# Patient Record
Sex: Female | Born: 1940 | Race: White | Hispanic: No | Smoking: Never smoker
Health system: Southern US, Community
[De-identification: ages and names within clinical notes are randomized; demographics above are authoritative.]

## PROBLEM LIST (undated history)

## (undated) DIAGNOSIS — F32A Depression, unspecified: Secondary | ICD-10-CM

## (undated) DIAGNOSIS — R159 Full incontinence of feces: Secondary | ICD-10-CM

## (undated) DIAGNOSIS — L4050 Arthropathic psoriasis, unspecified: Secondary | ICD-10-CM

## (undated) DIAGNOSIS — G4730 Sleep apnea, unspecified: Secondary | ICD-10-CM

## (undated) DIAGNOSIS — K5730 Diverticulosis of large intestine without perforation or abscess without bleeding: Secondary | ICD-10-CM

## (undated) DIAGNOSIS — K219 Gastro-esophageal reflux disease without esophagitis: Secondary | ICD-10-CM

## (undated) DIAGNOSIS — I499 Cardiac arrhythmia, unspecified: Secondary | ICD-10-CM

## (undated) DIAGNOSIS — M1990 Unspecified osteoarthritis, unspecified site: Secondary | ICD-10-CM

## (undated) DIAGNOSIS — Z87442 Personal history of urinary calculi: Secondary | ICD-10-CM

## (undated) DIAGNOSIS — I10 Essential (primary) hypertension: Secondary | ICD-10-CM

## (undated) DIAGNOSIS — L409 Psoriasis, unspecified: Secondary | ICD-10-CM

## (undated) DIAGNOSIS — R7303 Prediabetes: Secondary | ICD-10-CM

## (undated) HISTORY — PX: CERVICAL CERCLAGE: SHX1329

---

## 1981-12-02 HISTORY — PX: OTHER SURGICAL HISTORY: SHX169

## 2001-11-03 ENCOUNTER — Encounter: Payer: Self-pay | Admitting: Neurosurgery

## 2001-11-05 ENCOUNTER — Encounter: Payer: Self-pay | Admitting: Neurosurgery

## 2001-11-05 ENCOUNTER — Inpatient Hospital Stay (HOSPITAL_COMMUNITY): Admission: RE | Admit: 2001-11-05 | Discharge: 2001-11-06 | Payer: Self-pay | Admitting: Neurosurgery

## 2001-12-02 HISTORY — PX: BACK SURGERY: SHX140

## 2002-01-26 ENCOUNTER — Encounter: Payer: Self-pay | Admitting: Neurosurgery

## 2002-01-26 ENCOUNTER — Ambulatory Visit (HOSPITAL_COMMUNITY): Admission: RE | Admit: 2002-01-26 | Discharge: 2002-01-26 | Payer: Self-pay | Admitting: Neurosurgery

## 2004-07-12 ENCOUNTER — Ambulatory Visit (HOSPITAL_COMMUNITY): Admission: RE | Admit: 2004-07-12 | Discharge: 2004-07-12 | Payer: Self-pay | Admitting: Neurosurgery

## 2005-05-31 ENCOUNTER — Other Ambulatory Visit: Admission: RE | Admit: 2005-05-31 | Discharge: 2005-05-31 | Payer: Self-pay | Admitting: Anesthesiology

## 2011-12-03 HISTORY — PX: EYE SURGERY: SHX253

## 2022-01-29 ENCOUNTER — Other Ambulatory Visit: Payer: Self-pay | Admitting: Neurosurgery

## 2022-01-29 DIAGNOSIS — M48062 Spinal stenosis, lumbar region with neurogenic claudication: Secondary | ICD-10-CM

## 2022-01-29 DIAGNOSIS — M5416 Radiculopathy, lumbar region: Secondary | ICD-10-CM

## 2022-02-11 ENCOUNTER — Other Ambulatory Visit: Payer: Self-pay

## 2022-02-11 ENCOUNTER — Ambulatory Visit
Admission: RE | Admit: 2022-02-11 | Discharge: 2022-02-11 | Disposition: A | Discharge status: Home / Self Care | Payer: Medicare Other | Source: Ambulatory Visit | Attending: Neurosurgery | Admitting: Neurosurgery

## 2022-02-11 DIAGNOSIS — M48062 Spinal stenosis, lumbar region with neurogenic claudication: Secondary | ICD-10-CM | POA: Diagnosis present

## 2022-02-11 DIAGNOSIS — M5416 Radiculopathy, lumbar region: Secondary | ICD-10-CM | POA: Insufficient documentation

## 2022-02-27 ENCOUNTER — Other Ambulatory Visit: Payer: Self-pay | Admitting: Neurosurgery

## 2022-02-27 DIAGNOSIS — Z01818 Encounter for other preprocedural examination: Secondary | ICD-10-CM

## 2022-03-12 ENCOUNTER — Encounter
Admission: RE | Admit: 2022-03-12 | Discharge: 2022-03-12 | Disposition: A | Discharge status: Home / Self Care | Payer: Medicare Other | Source: Ambulatory Visit | Attending: Neurosurgery | Admitting: Neurosurgery

## 2022-03-12 ENCOUNTER — Encounter: Payer: Self-pay | Admitting: *Deleted

## 2022-03-12 VITALS — BP 139/80 | HR 80 | Temp 97.5°F | Resp 18 | Ht 66.5 in | Wt 182.8 lb

## 2022-03-12 DIAGNOSIS — Z7982 Long term (current) use of aspirin: Secondary | ICD-10-CM | POA: Insufficient documentation

## 2022-03-12 DIAGNOSIS — Z01818 Encounter for other preprocedural examination: Secondary | ICD-10-CM | POA: Diagnosis present

## 2022-03-12 DIAGNOSIS — E119 Type 2 diabetes mellitus without complications: Secondary | ICD-10-CM | POA: Insufficient documentation

## 2022-03-12 DIAGNOSIS — Z01812 Encounter for preprocedural laboratory examination: Secondary | ICD-10-CM

## 2022-03-12 DIAGNOSIS — Z792 Long term (current) use of antibiotics: Secondary | ICD-10-CM | POA: Diagnosis not present

## 2022-03-12 DIAGNOSIS — I480 Paroxysmal atrial fibrillation: Secondary | ICD-10-CM | POA: Diagnosis not present

## 2022-03-12 DIAGNOSIS — Z79899 Other long term (current) drug therapy: Secondary | ICD-10-CM | POA: Diagnosis not present

## 2022-03-12 HISTORY — DX: Depression, unspecified: F32.A

## 2022-03-12 HISTORY — DX: Prediabetes: R73.03

## 2022-03-12 HISTORY — DX: Diverticulosis of large intestine without perforation or abscess without bleeding: K57.30

## 2022-03-12 HISTORY — DX: Personal history of urinary calculi: Z87.442

## 2022-03-12 HISTORY — DX: Arthropathic psoriasis, unspecified: L40.50

## 2022-03-12 HISTORY — DX: Essential (primary) hypertension: I10

## 2022-03-12 HISTORY — DX: Sleep apnea, unspecified: G47.30

## 2022-03-12 HISTORY — DX: Unspecified osteoarthritis, unspecified site: M19.90

## 2022-03-12 HISTORY — DX: Psoriasis, unspecified: L40.9

## 2022-03-12 HISTORY — DX: Gastro-esophageal reflux disease without esophagitis: K21.9

## 2022-03-12 HISTORY — DX: Full incontinence of feces: R15.9

## 2022-03-12 HISTORY — DX: Cardiac arrhythmia, unspecified: I49.9

## 2022-03-12 LAB — URINALYSIS, ROUTINE W REFLEX MICROSCOPIC
Bacteria, UA: NONE SEEN
Bilirubin Urine: NEGATIVE
Glucose, UA: NEGATIVE mg/dL
Ketones, ur: NEGATIVE mg/dL
Nitrite: NEGATIVE
Protein, ur: 30 mg/dL — AB
RBC / HPF: >50 RBC/hpf — ABNORMAL HIGH (ref 0–5)
Specific Gravity, Urine: 1.024 (ref 1.005–1.030)
WBC, UA: >50 WBC/hpf — ABNORMAL HIGH (ref 0–5)
pH: 5 (ref 5.0–8.0)

## 2022-03-12 LAB — TYPE AND SCREEN
ABO/RH(D): O POS
Antibody Screen: NEGATIVE

## 2022-03-12 LAB — BASIC METABOLIC PANEL
Anion gap: 9 (ref 5–15)
BUN: 15 mg/dL (ref 8–23)
CO2: 30 mmol/L (ref 22–32)
Calcium: 9.6 mg/dL (ref 8.9–10.3)
Chloride: 100 mmol/L (ref 98–111)
Creatinine, Ser: 0.61 mg/dL (ref 0.44–1.00)
GFR, Estimated: >60 mL/min (ref >60)
Glucose, Bld: 196 mg/dL — ABNORMAL HIGH (ref 70–99)
Potassium: 3.2 mmol/L — ABNORMAL LOW (ref 3.5–5.1)
Sodium: 139 mmol/L (ref 135–145)

## 2022-03-12 LAB — CBC
HCT: 42.9 % (ref 36.0–46.0)
Hemoglobin: 13.9 g/dL (ref 12.0–15.0)
MCH: 31.2 pg (ref 26.0–34.0)
MCHC: 32.4 g/dL (ref 30.0–36.0)
MCV: 96.2 fL (ref 80.0–100.0)
Platelets: 246 10*3/uL (ref 150–400)
RBC: 4.46 MIL/uL (ref 3.87–5.11)
RDW: 11.9 % (ref 11.5–15.5)
WBC: 7.2 10*3/uL (ref 4.0–10.5)
nRBC: 0 % (ref 0.0–0.2)

## 2022-03-12 LAB — SURGICAL PCR SCREEN
MRSA, PCR: NEGATIVE
Staphylococcus aureus: NEGATIVE

## 2022-03-12 NOTE — Patient Instructions (Addendum)
?Your procedure is scheduled on: Friday March 22, 2022. ?Report to [facility] inside [facility] 2nd floor, stop by admissions desk before getting on elevator.  ?To find out your arrival time please call [phone] between 1PM - 3PM on Thursday March 21, 2022. ? ?Remember: Instructions that are not followed completely may result in serious medical risk,  ?up to and including death, or upon the discretion of your surgeon and anesthesiologist your  ?surgery may need to be rescheduled.  ? ?  _X__ 1. Do not eat food after midnight the night before your procedure. ?                No chewing gum or hard candies. You may drink clear liquids up to 2 hours ?                before you are scheduled to arrive for your surgery- DO not drink clear ?                liquids within 2 hours of the start of your surgery. ?                Clear Liquids include:  water, apple juice without pulp, clear Gatorade, G2 or  ?                Gatorade Zero (avoid Red/Purple/Blue), Black Coffee or Tea (Do not add ?                anything to coffee or tea). ? ?__X__2.  On the morning of surgery brush your teeth with toothpaste and water, you ?               may rinse your mouth with mouthwash if you wish.  Do not swallow any toothpaste or mouthwash. ?   ? _X__ 3.  No Alcohol for 24 hours before or after surgery. ? ? _X__ 4.  Do Not Smoke or use e-cigarettes For 24 Hours Prior to Your Surgery. ?                Do not use any chewable tobacco products for at least 6 hours prior to ?                Surgery. ? ?_X__  5.  Do not use any recreational drugs (marijuana, cocaine, heroin, ecstasy, MDMA or other) ?               For at least one week prior to your surgery.  Combination of these drugs with anesthesia ?               May have life threatening results. ? ?____  6.  Bring all medications with you on the day of surgery if instructed.  ? ?__X__  7.  Notify your doctor if there is any change in your medical condition   ?    (cold, fever, infections). ?    ?Do not wear jewelry, make-up, hairpins, clips or nail polish. ?Do not wear lotions, powders, or perfumes. You may wear deodorant. ?Do not shave 48 hours prior to surgery. Men may shave face and neck. ?Do not bring valuables to the hospital.   ? ?[facility] is not responsible for any belongings or valuables. ? ?Contacts, dentures or bridgework may not be worn into surgery. ?Leave your suitcase in the car. After surgery it may be brought to your room. ?For patients admitted to the hospital, discharge time is  determined by your ?treatment team. ?  ?Patients discharged the day of surgery will not be allowed to drive home.   ?Make arrangements for someone to be with you for the first 24 hours of your ?Same Day Discharge. ? ?  ?Please read over the following fact sheets that you were given:  ? Spine Surgery Book  ? ? ?__X__ Take these medicines the morning of surgery with A SIP OF WATER:  ? ? 1. pantoprazole (PROTONIX) 40 MG ? 2.  ? 3.  ? 4. ? 5. ? 6. ? ?____ Fleet Enema (as directed)  ? ?__X__ Use CHG Soap (or wipes) as directed ? ?____ Use Benzoyl Peroxide Gel as instructed ? ?____ Use inhalers on the day of surgery ? ?____ Stop metformin 2 days prior to surgery   ? ?____ Take 1/2 of usual insulin dose the night before surgery. No insulin the morning ?         of surgery.  ? ?__X__ Stop  ELIQUIS 2.5 MG TABS 3 days before your procedure (take last dose 03/18/22) and resume it 14 days after your procedure as instructed by your doctor. (Apr 06, 2022)  ? ?__X__ One Week prior to surgery- Stop Anti-inflammatories such as Ibuprofen, Aleve, Advil, Motrin, meloxicam (MOBIC), diclofenac, etodolac, ketorolac, Toradol, Daypro, piroxicam, Goody's or BC powders. OK TO USE TYLENOL IF NEEDED ?  ?__X__ Do not start any vitamins and or herbal supplements until after surgery.   ? ?____ Bring C-Pap to the hospital.  ? ? ?If you have any questions regarding your pre-procedure instructions,  ?Please  call Pre-admit Testing at [phone] ?

## 2022-03-13 NOTE — Pre-Procedure Instructions (Signed)
PCP clearance from Dr Lindwood Qua on chart-Low Risk ?

## 2022-03-14 LAB — URINE CULTURE

## 2022-03-22 ENCOUNTER — Inpatient Hospital Stay
Admission: RE | Admit: 2022-03-22 | Discharge: 2022-03-25 | DRG: 459 | Disposition: A | Discharge status: Home Health | Payer: Medicare Other | Attending: Neurosurgery | Admitting: Neurosurgery

## 2022-03-22 ENCOUNTER — Encounter
Admission: RE | Disposition: A | Discharge status: Home Health | Payer: Self-pay | Source: Home / Self Care | Attending: Neurosurgery

## 2022-03-22 ENCOUNTER — Other Ambulatory Visit: Payer: Self-pay

## 2022-03-22 ENCOUNTER — Inpatient Hospital Stay: Payer: Medicare Other

## 2022-03-22 ENCOUNTER — Inpatient Hospital Stay: Payer: Medicare Other | Admitting: Urgent Care

## 2022-03-22 ENCOUNTER — Encounter: Payer: Self-pay | Admitting: Neurosurgery

## 2022-03-22 DIAGNOSIS — M5116 Intervertebral disc disorders with radiculopathy, lumbar region: Principal | ICD-10-CM | POA: Diagnosis present

## 2022-03-22 DIAGNOSIS — E785 Hyperlipidemia, unspecified: Secondary | ICD-10-CM | POA: Diagnosis present

## 2022-03-22 DIAGNOSIS — L409 Psoriasis, unspecified: Secondary | ICD-10-CM

## 2022-03-22 DIAGNOSIS — G8929 Other chronic pain: Secondary | ICD-10-CM | POA: Diagnosis present

## 2022-03-22 DIAGNOSIS — G9340 Encephalopathy, unspecified: Secondary | ICD-10-CM

## 2022-03-22 DIAGNOSIS — E1142 Type 2 diabetes mellitus with diabetic polyneuropathy: Secondary | ICD-10-CM | POA: Diagnosis present

## 2022-03-22 DIAGNOSIS — G928 Other toxic encephalopathy: Secondary | ICD-10-CM | POA: Diagnosis not present

## 2022-03-22 DIAGNOSIS — Z882 Allergy status to sulfonamides status: Secondary | ICD-10-CM

## 2022-03-22 DIAGNOSIS — G4733 Obstructive sleep apnea (adult) (pediatric): Secondary | ICD-10-CM | POA: Diagnosis present

## 2022-03-22 DIAGNOSIS — M5125 Other intervertebral disc displacement, thoracolumbar region: Secondary | ICD-10-CM | POA: Diagnosis present

## 2022-03-22 DIAGNOSIS — F32A Depression, unspecified: Secondary | ICD-10-CM | POA: Diagnosis present

## 2022-03-22 DIAGNOSIS — M4726 Other spondylosis with radiculopathy, lumbar region: Secondary | ICD-10-CM | POA: Diagnosis present

## 2022-03-22 DIAGNOSIS — K219 Gastro-esophageal reflux disease without esophagitis: Secondary | ICD-10-CM | POA: Diagnosis present

## 2022-03-22 DIAGNOSIS — M48061 Spinal stenosis, lumbar region without neurogenic claudication: Secondary | ICD-10-CM | POA: Diagnosis present

## 2022-03-22 DIAGNOSIS — Z881 Allergy status to other antibiotic agents status: Secondary | ICD-10-CM

## 2022-03-22 DIAGNOSIS — Z7982 Long term (current) use of aspirin: Secondary | ICD-10-CM | POA: Diagnosis not present

## 2022-03-22 DIAGNOSIS — Z7901 Long term (current) use of anticoagulants: Secondary | ICD-10-CM

## 2022-03-22 DIAGNOSIS — L4050 Arthropathic psoriasis, unspecified: Secondary | ICD-10-CM | POA: Diagnosis present

## 2022-03-22 DIAGNOSIS — M2578 Osteophyte, vertebrae: Secondary | ICD-10-CM | POA: Diagnosis present

## 2022-03-22 DIAGNOSIS — I480 Paroxysmal atrial fibrillation: Secondary | ICD-10-CM | POA: Diagnosis present

## 2022-03-22 DIAGNOSIS — Z981 Arthrodesis status: Secondary | ICD-10-CM | POA: Diagnosis not present

## 2022-03-22 DIAGNOSIS — J9601 Acute respiratory failure with hypoxia: Principal | ICD-10-CM | POA: Diagnosis not present

## 2022-03-22 DIAGNOSIS — I10 Essential (primary) hypertension: Secondary | ICD-10-CM | POA: Diagnosis present

## 2022-03-22 DIAGNOSIS — Z79899 Other long term (current) drug therapy: Secondary | ICD-10-CM | POA: Diagnosis not present

## 2022-03-22 DIAGNOSIS — E1165 Type 2 diabetes mellitus with hyperglycemia: Secondary | ICD-10-CM | POA: Diagnosis present

## 2022-03-22 HISTORY — PX: LUMBAR LAMINECTOMY/DECOMPRESSION MICRODISCECTOMY: SHX5026

## 2022-03-22 HISTORY — PX: APPLICATION OF INTRAOPERATIVE CT SCAN: SHX6668

## 2022-03-22 HISTORY — PX: ANTERIOR LATERAL LUMBAR FUSION WITH PERCUTANEOUS SCREW 2 LEVEL: SHX5554

## 2022-03-22 LAB — GLUCOSE, CAPILLARY
Glucose-Capillary: 191 mg/dL — ABNORMAL HIGH (ref 70–99)
Glucose-Capillary: 291 mg/dL — ABNORMAL HIGH (ref 70–99)
Glucose-Capillary: 304 mg/dL — ABNORMAL HIGH (ref 70–99)
Glucose-Capillary: 258 mg/dL — ABNORMAL HIGH (ref 70–99)

## 2022-03-22 LAB — CBC
HCT: 41.2 % (ref 36.0–46.0)
Hemoglobin: 13.5 g/dL (ref 12.0–15.0)
MCH: 31.1 pg (ref 26.0–34.0)
MCHC: 32.8 g/dL (ref 30.0–36.0)
MCV: 94.9 fL (ref 80.0–100.0)
Platelets: 202 10*3/uL (ref 150–400)
RBC: 4.34 MIL/uL (ref 3.87–5.11)
RDW: 11.9 % (ref 11.5–15.5)
WBC: 13.3 10*3/uL — ABNORMAL HIGH (ref 4.0–10.5)
nRBC: 0 % (ref 0.0–0.2)

## 2022-03-22 LAB — MRSA NEXT GEN BY PCR, NASAL: MRSA by PCR Next Gen: NOT DETECTED

## 2022-03-22 LAB — POCT I-STAT, CHEM 8
BUN: 14 mg/dL (ref 8–23)
Calcium, Ion: 1.23 mmol/L (ref 1.15–1.40)
Chloride: 102 mmol/L (ref 98–111)
Creatinine, Ser: 0.6 mg/dL (ref 0.44–1.00)
Glucose, Bld: 156 mg/dL — ABNORMAL HIGH (ref 70–99)
HCT: 43 % (ref 36.0–46.0)
Hemoglobin: 14.6 g/dL (ref 12.0–15.0)
Potassium: 4.1 mmol/L (ref 3.5–5.1)
Sodium: 141 mmol/L (ref 135–145)
TCO2: 31 mmol/L (ref 22–32)

## 2022-03-22 LAB — CREATININE, SERUM
Creatinine, Ser: 0.59 mg/dL (ref 0.44–1.00)
GFR, Estimated: >60 mL/min (ref >60)

## 2022-03-22 LAB — ABO/RH: ABO/RH(D): O POS

## 2022-03-22 SURGERY — LUMBAR LAMINECTOMY/DECOMPRESSION MICRODISCECTOMY 1 LEVEL
Anesthesia: General | Site: Spine Lumbar

## 2022-03-22 MED ORDER — SODIUM CHLORIDE (PF) 0.9 % IJ SOLN
INTRAMUSCULAR | Status: DC | PRN
  Administered 2022-03-22: 60 mL via INTRAMUSCULAR

## 2022-03-22 MED ORDER — BISACODYL 10 MG RE SUPP: 10.0000 mg | Freq: Every day | RECTAL | Status: DC | PRN

## 2022-03-22 MED ORDER — SODIUM CHLORIDE 0.9% FLUSH
3.0000 mL | Freq: Two times a day (BID) | INTRAVENOUS | Status: DC
  Administered 2022-03-22 – 2022-03-25 (×5): 3 mL via INTRAVENOUS

## 2022-03-22 MED ORDER — LACTATED RINGERS IV SOLN: INTRAVENOUS | Status: DC

## 2022-03-22 MED ORDER — CHLORHEXIDINE GLUCONATE CLOTH 2 % EX PADS
6.0000 | MEDICATED_PAD | Freq: Every day | CUTANEOUS | Status: DC
  Administered 2022-03-22 – 2022-03-25 (×3): 6 via TOPICAL

## 2022-03-22 MED ORDER — PANTOPRAZOLE SODIUM 40 MG PO TBEC
40.0000 mg | DELAYED_RELEASE_TABLET | Freq: Every day | ORAL | Status: DC
  Administered 2022-03-22 – 2022-03-24 (×3): 40 mg via ORAL
  Filled 2022-03-22 (×3): qty 1

## 2022-03-22 MED ORDER — FLUMAZENIL 0.5 MG/5ML IV SOLN
INTRAVENOUS | Status: AC
  Filled 2022-03-22: qty 5

## 2022-03-22 MED ORDER — HYDROMORPHONE HCL 1 MG/ML IJ SOLN
INTRAMUSCULAR | Status: AC
  Administered 2022-03-22: 0.5 mg via INTRAVENOUS
  Filled 2022-03-22: qty 1

## 2022-03-22 MED ORDER — VANCOMYCIN HCL 1250 MG/250ML IV SOLN
1250.0000 mg | Freq: Once | INTRAVENOUS | Status: AC
  Administered 2022-03-22: 1250 mg via INTRAVENOUS
  Filled 2022-03-22: qty 250

## 2022-03-22 MED ORDER — PRAVASTATIN SODIUM 20 MG PO TABS
40.0000 mg | ORAL_TABLET | Freq: Every day | ORAL | Status: DC
  Administered 2022-03-22 – 2022-03-25 (×4): 40 mg via ORAL
  Filled 2022-03-22 (×4): qty 2

## 2022-03-22 MED ORDER — BUPIVACAINE-EPINEPHRINE (PF) 0.5% -1:200000 IJ SOLN
INTRAMUSCULAR | Status: AC
  Filled 2022-03-22: qty 30

## 2022-03-22 MED ORDER — SURGIFLO WITH THROMBIN (HEMOSTATIC MATRIX KIT) OPTIME
TOPICAL | Status: DC | PRN
Start: 2022-03-22 — End: 2022-03-22
  Administered 2022-03-22: 1 via TOPICAL

## 2022-03-22 MED ORDER — CEFAZOLIN SODIUM-DEXTROSE 2-4 GM/100ML-% IV SOLN: 2.0000 g | Freq: Once | INTRAVENOUS | Status: DC

## 2022-03-22 MED ORDER — MECLIZINE HCL 25 MG PO TABS
12.5000 mg | ORAL_TABLET | Freq: Three times a day (TID) | ORAL | Status: DC | PRN
  Filled 2022-03-22: qty 0.5

## 2022-03-22 MED ORDER — REMIFENTANIL HCL 1 MG IV SOLR
INTRAVENOUS | Status: DC | PRN
  Administered 2022-03-22: .1 ug/kg/min via INTRAVENOUS

## 2022-03-22 MED ORDER — ORAL CARE MOUTH RINSE
15.0000 mL | Freq: Two times a day (BID) | OROMUCOSAL | Status: DC
  Administered 2022-03-23 – 2022-03-25 (×4): 15 mL via OROMUCOSAL

## 2022-03-22 MED ORDER — MORPHINE SULFATE (PF) 2 MG/ML IV SOLN: 2.0000 mg | INTRAVENOUS | Status: DC | PRN

## 2022-03-22 MED ORDER — PHENYLEPHRINE HCL (PRESSORS) 10 MG/ML IV SOLN
INTRAVENOUS | Status: DC | PRN
  Administered 2022-03-22 (×3): 80 ug via INTRAVENOUS

## 2022-03-22 MED ORDER — PHENYLEPHRINE HCL (PRESSORS) 10 MG/ML IV SOLN
INTRAVENOUS | Status: AC
  Filled 2022-03-22: qty 1

## 2022-03-22 MED ORDER — PROPOFOL 1000 MG/100ML IV EMUL
INTRAVENOUS | Status: AC
  Filled 2022-03-22: qty 100

## 2022-03-22 MED ORDER — DEXMEDETOMIDINE HCL IN NACL 200 MCG/50ML IV SOLN
INTRAVENOUS | Status: DC | PRN
  Administered 2022-03-22: 8 ug via INTRAVENOUS
  Administered 2022-03-22: 4 ug via INTRAVENOUS

## 2022-03-22 MED ORDER — SODIUM CHLORIDE FLUSH 0.9 % IV SOLN
INTRAVENOUS | Status: AC
  Filled 2022-03-22: qty 20

## 2022-03-22 MED ORDER — ENOXAPARIN SODIUM 40 MG/0.4ML IJ SOSY
40.0000 mg | PREFILLED_SYRINGE | INTRAMUSCULAR | Status: DC
  Administered 2022-03-23 – 2022-03-25 (×3): 40 mg via SUBCUTANEOUS
  Filled 2022-03-22 (×3): qty 0.4

## 2022-03-22 MED ORDER — GABAPENTIN 300 MG PO CAPS
ORAL_CAPSULE | ORAL | Status: AC
  Administered 2022-03-22: 300 mg via ORAL
  Filled 2022-03-22: qty 1

## 2022-03-22 MED ORDER — BUPIVACAINE HCL (PF) 0.5 % IJ SOLN
INTRAMUSCULAR | Status: AC
  Filled 2022-03-22: qty 30

## 2022-03-22 MED ORDER — OXYCODONE HCL 5 MG PO TABS
5.0000 mg | ORAL_TABLET | ORAL | Status: DC | PRN
  Administered 2022-03-23 – 2022-03-25 (×2): 5 mg via ORAL
  Filled 2022-03-22 (×2): qty 1

## 2022-03-22 MED ORDER — NALOXONE HCL 0.4 MG/ML IJ SOLN
0.4000 mg | INTRAMUSCULAR | Status: DC | PRN
  Administered 2022-03-22: 0.4 mg via INTRAVENOUS

## 2022-03-22 MED ORDER — 0.9 % SODIUM CHLORIDE (POUR BTL) OPTIME
TOPICAL | Status: DC | PRN
  Administered 2022-03-22: 1000 mL

## 2022-03-22 MED ORDER — GABAPENTIN 600 MG PO TABS
300.0000 mg | ORAL_TABLET | Freq: Once | ORAL | Status: DC
Start: 2022-03-22 — End: 2022-03-22
  Filled 2022-03-22: qty 0.5

## 2022-03-22 MED ORDER — ACETAMINOPHEN 500 MG PO TABS
1000.0000 mg | ORAL_TABLET | Freq: Four times a day (QID) | ORAL | Status: AC
  Administered 2022-03-22 – 2022-03-23 (×3): 1000 mg via ORAL
  Filled 2022-03-22 (×4): qty 2

## 2022-03-22 MED ORDER — GABAPENTIN 100 MG PO CAPS
100.0000 mg | ORAL_CAPSULE | Freq: Every day | ORAL | Status: DC
  Administered 2022-03-23 – 2022-03-25 (×3): 100 mg via ORAL
  Filled 2022-03-22 (×3): qty 1

## 2022-03-22 MED ORDER — GABAPENTIN 300 MG PO CAPS: 300.0000 mg | ORAL_CAPSULE | Freq: Once | ORAL | Status: AC

## 2022-03-22 MED ORDER — FENTANYL CITRATE (PF) 100 MCG/2ML IJ SOLN
INTRAMUSCULAR | Status: AC
  Administered 2022-03-22: 25 ug via INTRAVENOUS
  Filled 2022-03-22: qty 2

## 2022-03-22 MED ORDER — HYDROMORPHONE HCL 1 MG/ML IJ SOLN
0.2500 mg | INTRAMUSCULAR | Status: DC | PRN
  Administered 2022-03-22 (×2): 0.5 mg via INTRAVENOUS

## 2022-03-22 MED ORDER — ONDANSETRON HCL 4 MG/2ML IJ SOLN: 4.0000 mg | Freq: Four times a day (QID) | INTRAMUSCULAR | Status: DC | PRN

## 2022-03-22 MED ORDER — FENTANYL CITRATE (PF) 100 MCG/2ML IJ SOLN
INTRAMUSCULAR | Status: DC | PRN
  Administered 2022-03-22 (×2): 50 ug via INTRAVENOUS

## 2022-03-22 MED ORDER — SODIUM CHLORIDE 0.9% FLUSH
3.0000 mL | INTRAVENOUS | Status: DC | PRN
Start: 2022-03-22 — End: 2022-03-25

## 2022-03-22 MED ORDER — DEXAMETHASONE SODIUM PHOSPHATE 10 MG/ML IJ SOLN
INTRAMUSCULAR | Status: DC | PRN
Start: 2022-03-22 — End: 2022-03-22
  Administered 2022-03-22: 5 mg via INTRAVENOUS

## 2022-03-22 MED ORDER — DOCUSATE SODIUM 100 MG PO CAPS
100.0000 mg | ORAL_CAPSULE | Freq: Two times a day (BID) | ORAL | Status: DC
  Administered 2022-03-23 – 2022-03-25 (×5): 100 mg via ORAL
  Filled 2022-03-22 (×6): qty 1

## 2022-03-22 MED ORDER — ACETAMINOPHEN 10 MG/ML IV SOLN
1000.0000 mg | Freq: Once | INTRAVENOUS | Status: AC
  Administered 2022-03-22: 1000 mg via INTRAVENOUS

## 2022-03-22 MED ORDER — SODIUM CHLORIDE (PF) 0.9 % IJ SOLN
INTRAMUSCULAR | Status: AC
  Filled 2022-03-22: qty 20

## 2022-03-22 MED ORDER — PROPOFOL 10 MG/ML IV BOLUS
INTRAVENOUS | Status: DC | PRN
  Administered 2022-03-22: 110 ug/kg/min via INTRAVENOUS
  Administered 2022-03-22: 150 ug/kg/min via INTRAVENOUS

## 2022-03-22 MED ORDER — GABAPENTIN 300 MG PO CAPS
300.0000 mg | ORAL_CAPSULE | Freq: Every day | ORAL | Status: DC
  Administered 2022-03-23 – 2022-03-24 (×2): 300 mg via ORAL
  Filled 2022-03-22 (×3): qty 1

## 2022-03-22 MED ORDER — OXYCODONE HCL 5 MG PO TABS
ORAL_TABLET | ORAL | Status: AC
  Filled 2022-03-22: qty 1

## 2022-03-22 MED ORDER — REMIFENTANIL HCL 1 MG IV SOLR
INTRAVENOUS | Status: AC
  Filled 2022-03-22: qty 1000

## 2022-03-22 MED ORDER — INSULIN ASPART 100 UNIT/ML IJ SOLN
0.0000 [IU] | INTRAMUSCULAR | Status: DC
  Administered 2022-03-22: 8 [IU] via SUBCUTANEOUS
  Administered 2022-03-22: 11 [IU] via SUBCUTANEOUS
  Administered 2022-03-23: 5 [IU] via SUBCUTANEOUS
  Administered 2022-03-23 (×3): 3 [IU] via SUBCUTANEOUS
  Filled 2022-03-22 (×6): qty 1

## 2022-03-22 MED ORDER — BUPIVACAINE-EPINEPHRINE 0.5% -1:200000 IJ SOLN
INTRAMUSCULAR | Status: DC | PRN
  Administered 2022-03-22: 2 mL
  Administered 2022-03-22: 10 mL

## 2022-03-22 MED ORDER — CEFAZOLIN SODIUM-DEXTROSE 2-4 GM/100ML-% IV SOLN
INTRAVENOUS | Status: AC
  Filled 2022-03-22: qty 100

## 2022-03-22 MED ORDER — ACETAMINOPHEN 10 MG/ML IV SOLN
INTRAVENOUS | Status: AC
  Filled 2022-03-22: qty 100

## 2022-03-22 MED ORDER — MENTHOL 3 MG MT LOZG: 1.0000 | LOZENGE | OROMUCOSAL | Status: DC | PRN

## 2022-03-22 MED ORDER — CALCIPOTRIENE 0.005 % EX OINT: TOPICAL_OINTMENT | Freq: Two times a day (BID) | CUTANEOUS | Status: DC

## 2022-03-22 MED ORDER — SODIUM CHLORIDE 0.9 % IV SOLN: 250.0000 mL | INTRAVENOUS | Status: DC

## 2022-03-22 MED ORDER — EPHEDRINE SULFATE (PRESSORS) 50 MG/ML IJ SOLN
INTRAMUSCULAR | Status: DC | PRN
  Administered 2022-03-22: 10 mg via INTRAVENOUS

## 2022-03-22 MED ORDER — DULOXETINE HCL 30 MG PO CPEP
60.0000 mg | ORAL_CAPSULE | Freq: Every day | ORAL | Status: DC
  Administered 2022-03-22 – 2022-03-25 (×4): 60 mg via ORAL
  Filled 2022-03-22 (×4): qty 2

## 2022-03-22 MED ORDER — SUCCINYLCHOLINE CHLORIDE 200 MG/10ML IV SOSY
PREFILLED_SYRINGE | INTRAVENOUS | Status: DC | PRN
  Administered 2022-03-22: 140 mg via INTRAVENOUS

## 2022-03-22 MED ORDER — ONDANSETRON HCL 4 MG/2ML IJ SOLN: 4.0000 mg | Freq: Once | INTRAMUSCULAR | Status: DC | PRN

## 2022-03-22 MED ORDER — OXYCODONE HCL 5 MG PO TABS
10.0000 mg | ORAL_TABLET | ORAL | Status: DC | PRN
  Administered 2022-03-23 – 2022-03-24 (×4): 10 mg via ORAL
  Filled 2022-03-22 (×4): qty 2

## 2022-03-22 MED ORDER — METRONIDAZOLE 0.75 % EX CREA: TOPICAL_CREAM | Freq: Two times a day (BID) | CUTANEOUS | Status: DC

## 2022-03-22 MED ORDER — PHENYLEPHRINE HCL-NACL 20-0.9 MG/250ML-% IV SOLN
INTRAVENOUS | Status: DC | PRN
  Administered 2022-03-22: 80 ug/min via INTRAVENOUS

## 2022-03-22 MED ORDER — ONDANSETRON HCL 4 MG/2ML IJ SOLN
INTRAMUSCULAR | Status: AC
  Filled 2022-03-22: qty 2

## 2022-03-22 MED ORDER — SODIUM CHLORIDE FLUSH 0.9 % IV SOLN
INTRAVENOUS | Status: AC
  Filled 2022-03-22: qty 10

## 2022-03-22 MED ORDER — DEXAMETHASONE SODIUM PHOSPHATE 10 MG/ML IJ SOLN
INTRAMUSCULAR | Status: AC
  Filled 2022-03-22: qty 1

## 2022-03-22 MED ORDER — METHOCARBAMOL 500 MG PO TABS
500.0000 mg | ORAL_TABLET | Freq: Four times a day (QID) | ORAL | Status: DC
  Administered 2022-03-22 – 2022-03-25 (×10): 500 mg via ORAL
  Filled 2022-03-22 (×11): qty 1

## 2022-03-22 MED ORDER — PROPOFOL 10 MG/ML IV BOLUS
INTRAVENOUS | Status: AC
  Filled 2022-03-22: qty 20

## 2022-03-22 MED ORDER — METHOCARBAMOL 1000 MG/10ML IJ SOLN
500.0000 mg | Freq: Four times a day (QID) | INTRAVENOUS | Status: DC
  Administered 2022-03-22: 500 mg via INTRAVENOUS
  Filled 2022-03-22: qty 5

## 2022-03-22 MED ORDER — LIDOCAINE HCL (CARDIAC) PF 100 MG/5ML IV SOSY
PREFILLED_SYRINGE | INTRAVENOUS | Status: DC | PRN
  Administered 2022-03-22: 100 mg via INTRAVENOUS

## 2022-03-22 MED ORDER — DEXMEDETOMIDINE HCL IN NACL 80 MCG/20ML IV SOLN
INTRAVENOUS | Status: AC
  Filled 2022-03-22: qty 20

## 2022-03-22 MED ORDER — LIDOCAINE HCL (PF) 2 % IJ SOLN
INTRAMUSCULAR | Status: AC
  Filled 2022-03-22: qty 5

## 2022-03-22 MED ORDER — FENTANYL CITRATE (PF) 100 MCG/2ML IJ SOLN
25.0000 ug | INTRAMUSCULAR | Status: AC | PRN
  Administered 2022-03-22 (×6): 25 ug via INTRAVENOUS

## 2022-03-22 MED ORDER — ONDANSETRON HCL 4 MG PO TABS: 4.0000 mg | ORAL_TABLET | Freq: Four times a day (QID) | ORAL | Status: DC | PRN

## 2022-03-22 MED ORDER — ONDANSETRON HCL 4 MG/2ML IJ SOLN
INTRAMUSCULAR | Status: DC | PRN
  Administered 2022-03-22: 4 mg via INTRAVENOUS

## 2022-03-22 MED ORDER — KETOROLAC TROMETHAMINE 15 MG/ML IJ SOLN: 15.0000 mg | Freq: Once | INTRAMUSCULAR | Status: AC

## 2022-03-22 MED ORDER — HYDROMORPHONE HCL 1 MG/ML IJ SOLN
0.5000 mg | INTRAMUSCULAR | Status: DC | PRN
  Administered 2022-03-22 (×2): 0.5 mg via INTRAVENOUS

## 2022-03-22 MED ORDER — AZELASTINE HCL 0.1 % NA SOLN: 1.0000 | Freq: Every day | NASAL | Status: DC | PRN

## 2022-03-22 MED ORDER — SUCCINYLCHOLINE CHLORIDE 200 MG/10ML IV SOSY
PREFILLED_SYRINGE | INTRAVENOUS | Status: AC
  Filled 2022-03-22: qty 10

## 2022-03-22 MED ORDER — FLEET ENEMA 7-19 GM/118ML RE ENEM: 1.0000 | ENEMA | Freq: Once | RECTAL | Status: DC | PRN

## 2022-03-22 MED ORDER — CHLORHEXIDINE GLUCONATE 0.12 % MT SOLN: 15.0000 mL | Freq: Once | OROMUCOSAL | Status: AC

## 2022-03-22 MED ORDER — VANCOMYCIN HCL IN DEXTROSE 1-5 GM/200ML-% IV SOLN
INTRAVENOUS | Status: AC
  Filled 2022-03-22: qty 200

## 2022-03-22 MED ORDER — PHENOL 1.4 % MT LIQD: 1.0000 | OROMUCOSAL | Status: DC | PRN

## 2022-03-22 MED ORDER — OXYCODONE HCL 5 MG PO TABS
5.0000 mg | ORAL_TABLET | Freq: Once | ORAL | Status: AC | PRN
  Administered 2022-03-22: 5 mg via ORAL

## 2022-03-22 MED ORDER — SODIUM CHLORIDE 0.9 % IV SOLN: INTRAVENOUS | Status: DC

## 2022-03-22 MED ORDER — DILTIAZEM HCL ER COATED BEADS 240 MG PO CP24
240.0000 mg | ORAL_CAPSULE | Freq: Every day | ORAL | Status: DC
Start: 2022-03-22 — End: 2022-03-25
  Administered 2022-03-22 – 2022-03-24 (×3): 240 mg via ORAL
  Filled 2022-03-22 (×3): qty 1

## 2022-03-22 MED ORDER — CHLORHEXIDINE GLUCONATE 0.12 % MT SOLN
OROMUCOSAL | Status: AC
Start: 2022-03-22 — End: 2022-03-22
  Administered 2022-03-22: 15 mL via OROMUCOSAL
  Filled 2022-03-22: qty 15

## 2022-03-22 MED ORDER — VITAMIN D 25 MCG (1000 UNIT) PO TABS
1000.0000 [IU] | ORAL_TABLET | Freq: Every day | ORAL | Status: DC
  Administered 2022-03-23 – 2022-03-25 (×3): 1000 [IU] via ORAL
  Filled 2022-03-22 (×3): qty 1

## 2022-03-22 MED ORDER — KETOROLAC TROMETHAMINE 15 MG/ML IJ SOLN
INTRAMUSCULAR | Status: AC
  Administered 2022-03-22: 15 mg via INTRAVENOUS
  Filled 2022-03-22: qty 1

## 2022-03-22 MED ORDER — ORAL CARE MOUTH RINSE: 15.0000 mL | Freq: Once | OROMUCOSAL | Status: AC

## 2022-03-22 MED ORDER — ROFLUMILAST 0.3 % EX CREA: 1.0000 "application " | TOPICAL_CREAM | Freq: Every day | CUTANEOUS | Status: DC

## 2022-03-22 MED ORDER — NALOXONE HCL 0.4 MG/ML IJ SOLN
INTRAMUSCULAR | Status: AC
  Filled 2022-03-22: qty 1

## 2022-03-22 MED ORDER — BUPIVACAINE LIPOSOME 1.3 % IJ SUSP
INTRAMUSCULAR | Status: AC
  Filled 2022-03-22: qty 20

## 2022-03-22 MED ORDER — KETOROLAC TROMETHAMINE 15 MG/ML IJ SOLN
7.5000 mg | Freq: Four times a day (QID) | INTRAMUSCULAR | Status: AC
  Administered 2022-03-22 – 2022-03-23 (×4): 7.5 mg via INTRAVENOUS
  Filled 2022-03-22 (×4): qty 1

## 2022-03-22 MED ORDER — FENTANYL CITRATE (PF) 100 MCG/2ML IJ SOLN
INTRAMUSCULAR | Status: AC
  Filled 2022-03-22: qty 2

## 2022-03-22 MED ORDER — POLYETHYLENE GLYCOL 3350 17 G PO PACK
17.0000 g | PACK | Freq: Every day | ORAL | Status: DC | PRN
  Filled 2022-03-22: qty 1

## 2022-03-22 MED ORDER — SENNA 8.6 MG PO TABS
1.0000 | ORAL_TABLET | Freq: Two times a day (BID) | ORAL | Status: DC
  Administered 2022-03-23 – 2022-03-24 (×3): 8.6 mg via ORAL
  Filled 2022-03-22 (×6): qty 1

## 2022-03-22 SURGICAL SUPPLY — 92 items
"PENCIL ELECTRO HAND CTR " (MISCELLANEOUS) ×2 IMPLANT
ADH SKN CLS APL DERMABOND .7 (GAUZE/BANDAGES/DRESSINGS) ×6
AGENT HMST KT MTR STRL THRMB (HEMOSTASIS) ×2
ALLOGRAFT BONE FIBER KORE 5 (Bone Implant) ×1 IMPLANT
APL PRP STRL LF DISP 70% ISPRP (MISCELLANEOUS) ×8
BUR NEURO DRILL SOFT 3.0X3.8M (BURR) ×3 IMPLANT
CAP LOCKING SPINE (Cap) ×6 IMPLANT
CHLORAPREP W/TINT 26 (MISCELLANEOUS) ×12 IMPLANT
CNTNR SPEC 2.5X3XGRAD LEK (MISCELLANEOUS) ×6
CONT SPEC 4OZ STER OR WHT (MISCELLANEOUS) ×3
CONT SPEC 4OZ STRL OR WHT (MISCELLANEOUS) ×6
CONTAINER SPEC 2.5X3XGRAD LEK (MISCELLANEOUS) ×2 IMPLANT
CORD BIP STRL DISP 12FT (MISCELLANEOUS) ×3 IMPLANT
CORD LIGHT LATERIAL X LIFT (MISCELLANEOUS) ×1 IMPLANT
COUNTER NEEDLE 20/40 LG (NEEDLE) ×6 IMPLANT
COVER BACK TABLE REUSABLE LG (DRAPES) ×3 IMPLANT
CUP MEDICINE 2OZ PLAST GRAD ST (MISCELLANEOUS) ×6 IMPLANT
DERMABOND ADVANCED (GAUZE/BANDAGES/DRESSINGS) ×3
DERMABOND ADVANCED .7 DNX12 (GAUZE/BANDAGES/DRESSINGS) ×6 IMPLANT
DRAPE C ARM PK CFD 31 SPINE (DRAPES) ×3 IMPLANT
DRAPE C-ARMOR (DRAPES) ×1 IMPLANT
DRAPE INCISE IOBAN 66X45 STRL (DRAPES) ×3 IMPLANT
DRAPE LAPAROTOMY 100X77 ABD (DRAPES) ×6 IMPLANT
DRAPE MICROSCOPE SPINE 48X150 (DRAPES) ×4 IMPLANT
DRAPE SCAN PATIENT (DRAPES) ×3 IMPLANT
DRAPE SURG 17X11 SM STRL (DRAPES) ×6 IMPLANT
DRSG OPSITE POSTOP 3X4 (GAUZE/BANDAGES/DRESSINGS) ×1 IMPLANT
DRSG OPSITE POSTOP 4X6 (GAUZE/BANDAGES/DRESSINGS) ×1 IMPLANT
DRSG OPSITE POSTOP 4X8 (GAUZE/BANDAGES/DRESSINGS) ×1 IMPLANT
ELECT CAUTERY BLADE TIP 2.5 (TIP) ×3
ELECT EZSTD 165MM 6.5IN (MISCELLANEOUS) ×3
ELECT REM PT RETURN 9FT ADLT (ELECTROSURGICAL) ×6
ELECTRODE CAUTERY BLDE TIP 2.5 (TIP) ×2 IMPLANT
ELECTRODE EZSTD 165MM 6.5IN (MISCELLANEOUS) ×2 IMPLANT
ELECTRODE REM PT RTRN 9FT ADLT (ELECTROSURGICAL) ×4 IMPLANT
EX-PIN ORTHOLOCK NAV 4X150 (PIN) ×1 IMPLANT
FEE INTRAOP CADWELL SUPPLY NCS (MISCELLANEOUS) ×2 IMPLANT
FEE INTRAOP MONITOR IMPULS NCS (MISCELLANEOUS) ×2 IMPLANT
FORCEPS BPLR BAYO 10IN 1.0TIP (ORTHOPEDIC DISPOSABLE SUPPLIES) ×1 IMPLANT
GAUZE 4X4 16PLY ~~LOC~~+RFID DBL (SPONGE) ×3 IMPLANT
GLOVE BIOGEL PI IND STRL 6.5 (GLOVE) ×6 IMPLANT
GLOVE BIOGEL PI INDICATOR 6.5 (GLOVE) ×3
GLOVE SURG SYN 6.5 ES PF (GLOVE) ×15 IMPLANT
GLOVE SURG SYN 6.5 PF PI (GLOVE) ×10 IMPLANT
GLOVE SURG SYN 8.5  E (GLOVE) ×15
GLOVE SURG SYN 8.5 E (GLOVE) ×10 IMPLANT
GLOVE SURG SYN 8.5 PF PI (GLOVE) ×12 IMPLANT
GOWN SRG LRG LVL 4 IMPRV REINF (GOWNS) ×4 IMPLANT
GOWN SRG XL LVL 3 NONREINFORCE (GOWNS) ×4 IMPLANT
GOWN STRL NON-REIN TWL XL LVL3 (GOWNS) ×6
GOWN STRL REIN LRG LVL4 (GOWNS) ×6
GRADUATE 1200CC STRL 31836 (MISCELLANEOUS) ×3 IMPLANT
INTRAOP CADWELL SUPPLY FEE NCS (MISCELLANEOUS) ×2
INTRAOP DISP SUPPLY FEE NCS (MISCELLANEOUS) ×3
INTRAOP MONITOR FEE IMPULS NCS (MISCELLANEOUS) ×2
INTRAOP MONITOR FEE IMPULSE (MISCELLANEOUS) ×3
K-WIRE 1.6 NITINOL SHARP TIP (WIRE) ×18
KIT DISP MARS 3V (KITS) ×1 IMPLANT
KIT PEDICLE ACCESS (KITS) ×1 IMPLANT
KIT SPINAL PRONEVIEW (KITS) ×3 IMPLANT
KIT TURNOVER KIT A (KITS) ×3 IMPLANT
KNIFE BAYONET SHORT DISCETOMY (MISCELLANEOUS) IMPLANT
KWIRE 1.6 NITINOL SHARP TIP (WIRE) IMPLANT
MANIFOLD NEPTUNE II (INSTRUMENTS) ×5 IMPLANT
MARKER SKIN DUAL TIP RULER LAB (MISCELLANEOUS) ×9 IMPLANT
MARKER SPHERE PSV REFLC 13MM (MARKER) ×24 IMPLANT
NDL SAFETY ECLIPSE 18X1.5 (NEEDLE) ×2 IMPLANT
NEEDLE HYPO 18GX1.5 SHARP (NEEDLE) ×3
NEEDLE HYPO 22GX1.5 SAFETY (NEEDLE) ×3 IMPLANT
NS IRRIG 1000ML POUR BTL (IV SOLUTION) ×3 IMPLANT
PACK LAMINECTOMY NEURO (CUSTOM PROCEDURE TRAY) ×3 IMPLANT
PAD ARMBOARD 7.5X6 YLW CONV (MISCELLANEOUS) ×4 IMPLANT
PENCIL ELECTRO HAND CTR (MISCELLANEOUS) ×3 IMPLANT
ROD SPINAL 5.5X75 (Rod) ×1 IMPLANT
ROD SPINAL 5.5X80 (Rod) ×1 IMPLANT
SCREW CREO 6.5X45 FEN (Screw) ×6 IMPLANT
SCREW CREO MIS 30 TULIP (Screw) ×6 IMPLANT
SPACER HEDRON 10D 18X45X11 (Spacer) ×1 IMPLANT
STAPLER SKIN PROX 35W (STAPLE) ×1 IMPLANT
SURGIFLO W/THROMBIN 8M KIT (HEMOSTASIS) ×3 IMPLANT
SUT DVC VLOC 3-0 CL 6 P-12 (SUTURE) ×5 IMPLANT
SUT VIC AB 0 CT1 27 (SUTURE) ×9
SUT VIC AB 0 CT1 27XCR 8 STRN (SUTURE) ×2 IMPLANT
SUT VIC AB 2-0 CT1 18 (SUTURE) ×6 IMPLANT
SYR 10ML LL (SYRINGE) ×3 IMPLANT
SYR 20ML LL LF (SYRINGE) ×3 IMPLANT
SYR 30ML LL (SYRINGE) ×6 IMPLANT
TOWEL OR 17X26 4PK STRL BLUE (TOWEL DISPOSABLE) ×17 IMPLANT
TRAY FOLEY MTR SLVR 16FR STAT (SET/KITS/TRAYS/PACK) ×1 IMPLANT
TROCAR INSERT W/PEDICLE NDL (TROCAR) IMPLANT
TROCAR INSERT W/PEDICLE NDLE (TROCAR)
TUBING CONNECTING 10 (TUBING) ×7 IMPLANT

## 2022-03-22 NOTE — Anesthesia Preprocedure Evaluation (Addendum)
Anesthesia Evaluation  ?Patient identified by MRN, date of birth, ID band ?Patient awake ? ? ? ?Airway ?Mallampati: II ? ?TM Distance: >3 FB ?Neck ROM: Full ? ? ? Dental ? ?(+) Teeth Intact, Caps,  ?  ?Pulmonary ?sleep apnea ,  ?  ?breath sounds clear to auscultation ? ? ? ? ? ? Cardiovascular ?Exercise Tolerance: Good ?hypertension, Pt. on medications ? ?Rhythm:Regular  ? ?  ?Neuro/Psych ?Depression   ? GI/Hepatic ?Neg liver ROS, GERD  ,  ?Endo/Other  ?negative endocrine ROS ? Renal/GU ?negative Renal ROS  ? ?  ?Musculoskeletal ? ?(+) Arthritis ,  ? Abdominal ?Normal abdominal exam  (+)   ?Peds ?negative pediatric ROS ?(+)  Hematology ?negative hematology ROS ?(+)   ?Anesthesia Other Findings ? ? Reproductive/Obstetrics ? ?  ? ? ? ? ? ? ? ? ? ? ? ? ? ?  ?  ? ? ? ? ? ? ? ?Anesthesia Physical ?Anesthesia Plan ? ?ASA: 3 ? ?Anesthesia Plan: General  ? ?Post-op Pain Management:   ? ?Induction: Intravenous ? ?PONV Risk Score and Plan: 1 and Ondansetron and Dexamethasone ? ?Airway Management Planned: Oral ETT ? ?Additional Equipment:  ? ?Intra-op Plan:  ? ?Post-operative Plan: Extubation in OR ? ?Informed Consent: I have reviewed the patients History and Physical, chart, labs and discussed the procedure including the risks, benefits and alternatives for the proposed anesthesia with the patient or authorized representative who has indicated his/her understanding and acceptance.  ? ? ? ?Dental advisory given ? ?Plan Discussed with: CRNA and Surgeon ? ?Anesthesia Plan Comments:   ? ? ? ? ? ? ?Anesthesia Quick Evaluation ? ?

## 2022-03-22 NOTE — Assessment & Plan Note (Addendum)
Continue Cardizem CD 

## 2022-03-22 NOTE — Anesthesia Postprocedure Evaluation (Signed)
Anesthesia Post Note ? ?Patient: Allayah Raineri Pacer ? ?Procedure(s) Performed: LEFT T12-L1 DECOMPRESSION (Left: Spine Lumbar) ?L2-3 LATERAL LUMBAR FUSION WITH L2-4 POSTERIOR SPINAL FUSION (Spine Lumbar) ?APPLICATION OF INTRAOPERATIVE CT SCAN (Spine Lumbar) ? ?Patient location during evaluation: PACU ?Anesthesia Type: General ?Level of consciousness: awake and alert ?Pain management: pain level controlled ?Vital Signs Assessment: post-procedure vital signs reviewed and stable ?Respiratory status: spontaneous breathing, nonlabored ventilation and respiratory function stable ?Cardiovascular status: blood pressure returned to baseline and stable ?Postop Assessment: no apparent nausea or vomiting ?Anesthetic complications: no ? ? ?No notable events documented. ? ? ?Last Vitals:  ?Vitals:  ? 03/22/22 1830 03/22/22 1920  ?BP: (!) 146/95 (!) 160/102  ?Pulse: 86   ?Resp: 13   ?Temp: (!) 36.2 ?C 36.9 ?C  ?SpO2: 100%   ?  ?Last Pain:  ?Vitals:  ? 03/22/22 1920  ?TempSrc: Oral  ?PainSc: 8   ? ? ?  ?  ?  ?  ?  ?  ? ?Foye Deer ? ? ? ? ?

## 2022-03-22 NOTE — Assessment & Plan Note (Addendum)
Multifactorial secondary to opiates as well as OSA with CPAP requirement. ?--supplement O2 per protocol ?--maintain sats >90% ?

## 2022-03-22 NOTE — H&P (Addendum)
?  ?  ?[facility] ? ?Consult note ? ?PATIENT NAME: Katie Hodges   ? ?MR#:  [id number] ? ?DATE OF BIRTH:  06/17/1941 ? ?DATE OF ADMISSION:  03/22/2022 ? ?PRIMARY CARE PHYSICIAN: Lindwood QuaHoffman, Byron, MD  ? ?Patient is coming from: Home ? ?REQUESTING/REFERRING PHYSICIAN:Yarbrough, Annia Friendlyhester, MD ? ?CHIEF COMPLAINT:  ?Low pulse oximetry and decreased responsiveness ? ?HISTORY OF PRESENT ILLNESS:  ?Katie Hodges is a 81 y.o. Caucasian female with medical history significant for hypertension, GERD hypertension, type 2 diabetes mellitus, paroxysmal atrial fibrillation on Eliquis, OSA on home CPAP, depression and psoriasis, who underwent post lateral fusion L2-4 and posterior decompression T12-L1 and postoperatively upon arrival to her floor bed started having decreased responsiveness with respiratory compromise and inability to breathe.  She was helped to an Ambu bag and was bagged.  She was responsive to sternal rub then but unable to follow commands.  Rapid response team was called.  The patient was later breathing on her own and was changed to nonrebreather.  She received a few doses of Dilaudid earlier postoperatively.  She was given IV Narcan and she woke up more starting to move arms more and was pushing staff away.  She was then taken to the ICU.  With improvement of her breathing status her O2 was downgraded to 2 L/min of nasal cannula.  She was then responsive to her name though mildly somnolent.  She denies any preceding dyspnea or cough or wheezing.  No fever or chills.  No chest pain or palpitations.  No nausea or vomiting or abdominal pain.  She uses CPAP nightly for her OSA.  She denies any history of COPD or asthma.  No dysuria, oliguria or hematuria or flank pain.  No fever or chills. ? ?ED Course: BP was 157/90 with pulse symmetry of 90% on 3 0 O2 nasal cannula.  Labs revealed CBC with WBC of 13.3 and blood glucose was 291 and later 304. ? ?Imaging: Lumbar spine x-ray showed the following: ?Limited  intraoperative fluoroscopic views of the lumbar spine as ?described. Imaging displays rod and pedicle screw fixation spanning ?L2 through L4 with placement of interbody cage at L2-3. ? ?I was consulted for medical evaluation and management. ? ?PAST MEDICAL HISTORY:  ? ?Past Medical History:  ?Diagnosis Date  ? Arthritis   ? Depressive disorder   ? Diverticulosis of colon   ? Dysrhythmia   ? Full incontinence of feces   ? GERD (gastroesophageal reflux disease)   ? History of kidney stones   ? Hypertension   ? Pre-diabetes   ? Psoriasis   ? Psoriatic arthritis (HCC)   ? Sleep apnea   ?-Type II diabetes mellitus ?- Paroxysmal atrial fibrillation on Eliquis ?- Obstructive sleep apnea on home CPAP ? ?PAST SURGICAL HISTORY:  ? ?Past Surgical History:  ?Procedure Laterality Date  ? BACK SURGERY  2003  [place]  ? CERVICAL CERCLAGE    ? x2  ? EYE SURGERY  2013  ? cataracts  ? hemmorrhoidectomy  1983  ? ? ?SOCIAL HISTORY:  ? ?Social History  ? ?Tobacco Use  ? Smoking status: Never  ? Smokeless tobacco: Never  ?Substance Use Topics  ? Alcohol use: Not Currently  ? ? ?FAMILY HISTORY:  ?History reviewed. No pertinent family history. ? ?DRUG ALLERGIES:  ? ?Allergies  ?Allergen Reactions  ? Clindamycin Rash  ? Sulfa Antibiotics Rash  ? ? ?REVIEW OF SYSTEMS:  ? ?ROS ?As per history of present illness. All  pertinent systems were reviewed above. Constitutional, HEENT, cardiovascular, respiratory, GI, GU, musculoskeletal, neuro, psychiatric, endocrine, integumentary and hematologic systems were reviewed and are otherwise negative/unremarkable except for positive findings mentioned above in the HPI. ? ? ?MEDICATIONS AT HOME:  ? ?Prior to Admission medications   ?Medication Sig Start Date End Date Taking? Authorizing Provider  ?azelastine (ASTELIN) 0.1 % nasal spray Place 1-2 sprays into both nostrils daily as needed for rhinitis. 07/11/21  Yes [provider]  ?calcipotriene (DOVONOX) 0.005 % ointment Apply  topically 2 (two) times daily.   Yes [provider]  ?Calcipotriene-Betameth Diprop (ENSTILAR) 0.005-0.064 % FOAM Apply topically.   Yes [provider]  ?Cholecalciferol (VITAMIN D) 125 MCG (5000 UT) CAPS Take 10,000 Units by mouth daily.   Yes [provider]  ?clobetasol cream (TEMOVATE) 0.05 % Apply 1 application. topically 2 (two) times daily.   Yes [provider]  ?COSENTYX SENSOREADY, 300 MG, 150 MG/ML SOAJ Inject 300 mg into the skin every 30 (thirty) days. 02/11/22  Yes [provider]  ?diltiazem (CARDIZEM CD) 240 MG 24 hr capsule Take 240 mg by mouth at bedtime. 01/22/22  Yes [provider]  ?DULoxetine (CYMBALTA) 60 MG capsule Take 60 mg by mouth daily. 12/18/21  Yes [provider]  ?gabapentin (NEURONTIN) 100 MG capsule Take 100 mg by mouth daily. 02/11/22  Yes [provider]  ?gabapentin (NEURONTIN) 300 MG capsule Take 300 mg by mouth at bedtime. 01/16/22  Yes [provider]  ?HYDROcodone-acetaminophen (NORCO/VICODIN) 5-325 MG tablet Take 1 tablet by mouth every 6 (six) hours as needed for moderate pain.   Yes [provider]  ?ibuprofen (ADVIL) 400 MG tablet Take 400 mg by mouth every 6 (six) hours as needed.   Yes [provider]  ?meclizine (ANTIVERT) 12.5 MG tablet Take 12.5 mg by mouth 3 (three) times daily as needed for dizziness.   Yes [provider]  ?metroNIDAZOLE (METROCREAM) 0.75 % cream Apply topically 2 (two) times daily.   Yes [provider]  ?Multiple Vitamin (MULTIVITAMIN ADULT PO) Take by mouth.   Yes [provider]  ?pantoprazole (PROTONIX) 40 MG tablet Take 40 mg by mouth daily. 12/18/21  Yes [provider]  ?pravastatin (PRAVACHOL) 40 MG tablet Take 40 mg by mouth daily. 01/06/22  Yes [provider]  ?Roflumilast (ZORYVE) 0.3 % CREA Apply 1 application. topically at bedtime. Sample   Yes [provider]  ?aspirin EC 81 MG tablet  Take 81 mg by mouth daily. Swallow whole. ?Patient not taking: Reported on 03/22/2022    [provider]  ?cephALEXin (KEFLEX) 500 MG capsule Take 500 mg by mouth 4 (four) times daily. ?Patient not taking: Reported on 03/22/2022    [provider]  ?ELIQUIS 2.5 MG TABS tablet Take 2.5 mg by mouth 2 (two) times daily. 02/13/22   [provider]  ?traZODone (DESYREL) 50 MG tablet Take 50 mg by mouth at bedtime. ?Patient not taking: Reported on 03/22/2022    [provider]  ? ?  ? ?VITAL SIGNS:  ?Blood pressure (!) 154/93, pulse 100, temperature 98.5 ?F (36.9 ?C), temperature source Oral, resp. rate (!) 9, height 5\' 6"  (1.676 m), weight 85.5 kg, SpO2 94 %. ? ?PHYSICAL EXAMINATION:  ?Physical Exam ? ?GENERAL:  81 y.o.-year-old Caucasian female patient lying in the bed with no acute distress.  ?EYES: Pupils equal, round, reactive to light and accommodation. No scleral icterus. Extraocular muscles intact.  ?HEENT: Head atraumatic, normocephalic. Oropharynx and  nasopharynx clear.  ?NECK:  Supple, no jugular venous distention. No thyroid enlargement, no tenderness.  ?LUNGS: Normal breath sounds bilaterally, no wheezing, rales,rhonchi or crepitation. No use of accessory muscles of respiration.  ?CARDIOVASCULAR: Regular rate and rhythm, S1, S2 normal. No murmurs, rubs, or gallops.  ?ABDOMEN: Soft, nondistended, nontender. Bowel sounds present. No organomegaly or mass.  ?EXTREMITIES: No pedal edema, cyanosis, or clubbing.  ?NEUROLOGIC: Cranial nerves II through XII are intact. Muscle strength 5/5 in all extremities. Sensation intact. Gait not checked.  ?PSYCHIATRIC: The patient is alert and oriented x 3.  Normal affect and good eye contact. ?SKIN: No obvious rash, lesion, or ulcer.  ? ?LABORATORY PANEL:  ? ?CBC ?Recent Labs  ?Lab 03/22/22 ?2020  ?WBC 13.3*  ?HGB 13.5  ?HCT 41.2  ?PLT 202   ? ?------------------------------------------------------------------------------------------------------------------ ? ?Chemistries  ?Recent Labs  ?Lab 03/22/22 ?1209 03/22/22 ?2020  ?NA 141  --   ?K 4.1  --   ?CL 102  --   ?GLUCOSE 156*  --   ?BUN 14  --   ?CREATININE 0.60 0.59  ? ?-------------------------------

## 2022-03-22 NOTE — Discharge Instructions (Addendum)
NEUROSURGERY DISCHARGE INSTRUCTIONS ? 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 ?Operative procedure:  ?Anterior: ?1. Anterior lumbar interbody fusion via a left lateral retroperitoneal approach at L2/3 ?2. Placement of a Lordotic Globus Hedron interbody cage, filled with Demineralized Bone Matrix ?Posterior ?1. Posterior instrumentation using Globus Creo Instrumentation ?2. Posterolateral fusion, L2-4  ?3. Posterior decompression, T12-L1  ?  ?What to do after you leave the hospital: ? ?Recommended diet: regular diet. Increase protein intake to promote wound healing. ? ?Recommended activity: no lifting, driving, or strenuous exercise for 4 weeks . You should walk multiple times per day ? ?Special Instructions ? ?*Hold Plavix for 14 days post-op ?*Hold Cosentyx 3 weeks post-op ?No straining, no heavy lifting > 10lbs x 4 weeks.  Keep incision area clean and dry. May shower in 2 days. No baths or pools for 6 weeks.  ?Please remove dressing tomorrow, no need to apply a bandage afterwards ? ?You have no sutures to remove, the skin is closed with adhesive ? ?Please take pain medications as directed. Take a stool softener if on pain medications ? ? ?Please Report any of the following: ?Nausea or Vomiting, Temperature is greater than 101.62F (38.1C) degrees, Dizziness, Abdominal Pain, Difficulty Breathing or Shortness of Breath, Inability to Eat, drink Fluids, or Take medications, Bleeding, swelling, or drainage from surgical incision sites, New numbness or weakness, and Bowel or bladder dysfunction to the neurosurgeon on call at [phone] ? ?Additional Follow up appointments ?Please follow up with Cooper Render PA-C in [facility] as scheduled in 2-3 weeks ? ? ?Please see below for scheduled appointments: ? ?No future appointments. ? ?  ?

## 2022-03-22 NOTE — Transfer of Care (Signed)
Immediate Anesthesia Transfer of Care Note ? ?Patient: Katie Hodges ? ?Procedure(s) Performed: LEFT T12-L1 DECOMPRESSION (Left: Spine Lumbar) ?L2-3 LATERAL LUMBAR FUSION WITH L2-4 POSTERIOR SPINAL FUSION (Spine Lumbar) ?APPLICATION OF INTRAOPERATIVE CT SCAN (Spine Lumbar) ? ?Patient Location: PACU ? ?Anesthesia Type:General ? ?Level of Consciousness: drowsy ? ?Airway & Oxygen Therapy: Patient Spontanous Breathing and Patient connected to face mask oxygen ? ?Post-op Assessment: Report given to RN and Post -op Vital signs reviewed and stable ? ?Post vital signs: Reviewed and stable ? ?Last Vitals:  ?Vitals Value Taken Time  ?BP 148/87 03/22/22 1615  ?Temp 36.8 ?C 03/22/22 1615  ?Pulse 86 03/22/22 1626  ?Resp 17 03/22/22 1627  ?SpO2 97 % 03/22/22 1626  ?Vitals shown include unvalidated device data. ? ?Last Pain:  ?Vitals:  ? 03/22/22 1615  ?PainSc: Asleep  ?   ? ?  ? ?Complications: No notable events documented. ?

## 2022-03-22 NOTE — Anesthesia Procedure Notes (Signed)
Procedure Name: Intubation ?Date/Time: 03/22/2022 12:28 PM ?Performed by: Morene Crocker, CRNA ?Pre-anesthesia Checklist: Patient identified, Patient being monitored, Timeout performed, Emergency Drugs available and Suction available ?Patient Re-evaluated:Patient Re-evaluated prior to induction ?Oxygen Delivery Method: Circle system utilized ?Preoxygenation: Pre-oxygenation with 100% oxygen ?Induction Type: IV induction ?Ventilation: Mask ventilation without difficulty ?Laryngoscope Size: 3 and McGraph ?Grade View: Grade I ?Tube type: Oral ?Tube size: 7.0 mm ?Number of attempts: 1 ?Airway Equipment and Method: Stylet ?Placement Confirmation: ETT inserted through vocal cords under direct vision, positive ETCO2 and breath sounds checked- equal and bilateral ?Secured at: 21 cm ?Tube secured with: Tape ?Dental Injury: Teeth and Oropharynx as per pre-operative assessment  ?Comments: Pt. Intubated in her stretcher  ? ? ? ? ?

## 2022-03-22 NOTE — OR Nursing (Signed)
This RN took pt to assigned room on ortho floor. Pt was talking in elevator. Upon arrival to room pt was not breathing or responding. This RN hooked up ambu bag and began bagging pt. Pt was responsive to sternal rubs but unable to really follow commands. 1A staff called a rapid response and RT and ICU charge RN responded to rapid response. Pt was breathing on own and was changed to nonrebreather. Pt hooked up to telemetry. 1A staff checked CBG and informed Dr Marcell Barlow. Narcan given, pt woke up more and started moving arms more, pushing staff away. Pt taken to ICU 10 by this RN and RT on nonrebreather. Pt more responsive and breathing on own. Report given to ICU RN.  ?

## 2022-03-22 NOTE — Assessment & Plan Note (Addendum)
Continue statin. 

## 2022-03-22 NOTE — Assessment & Plan Note (Addendum)
continue Cymbalta

## 2022-03-22 NOTE — H&P (Signed)
?History of Present Illness: ?03/22/2022 ?Katie Hodges presents today with continued symptoms. ? ?02/19/2022  ?Katie Hodges continues to have symptoms. ? ?01/24/22 ?Katie Hodges is here today with a chief complaint of pain in her lower back that radiates around to her left groin. ? ?She began having pain in December 2022. She has had other bouts of pain. She tried physical therapy but was discharged. She had an injection on Monday which has helped. ? ?Pain is primarily in her left posterior hip and also into her groin. Standing and walking make it worse. Oxycodone helps a little bit. ? ?This is dramatically impairing her life. ? ?Bowel/Bladder Dysfunction: none ? ?Conservative measures:  ?Physical therapy: participated in 1 visit 01/02/22 at [facility] and was told they cannot help her ?Multimodal medical therapy including regular antiinflammatories: oxycodone, gabapentin, cymbalta, tylenol, ibuprofen, prednisone ?Injections: has received epidural steroid injections ?01/21/22: L3 TFESI at [facility] ?01/07/22: Left T12 TFESI at [facility] ? ?Past Surgery: Lumbar Fusion in early 2000's by Dr. Gerlene Fee ? ?Katie Hodges has no symptoms of cervical myelopathy. ? ?The symptoms are causing a significant impact on the patient's life.  ? ?Review of Systems:  ?A 10 point review of systems is negative, except for the pertinent positives and negatives detailed in the HPI. ? ?Past Medical History: ?Past Medical History:  ?Diagnosis Date  ? Atrial fibrillation (CMS-HCC)  ? Colon polyps  ? Depression  ? Diabetes mellitus without complication (CMS-HCC)  ? Diabetic polyneuropathy associated with type 2 diabetes mellitus (CMS-HCC)  ? GERD (gastroesophageal reflux disease)  ? Hearing impairment  ? Hyperlipidemia  ? Hypertension  ? Kidney stone  ? Left ventricular hypertrophy  ? Psoriasis  ? Psoriatic arthritis (CMS-HCC)  ? Sleep apnea  ? Visual impairment  ? Vitamin D deficiency  ? ?Past Surgical History: ?Past Surgical  History:  ?Procedure Laterality Date  ? VARICOSE VEIN SURGERY 2008  ? PR COLSC FLX W/RMVL OF TUMOR POLYP LESION SNARE TQ 2014  ? HEMORRHOID SURGERY  ? SPINE SURGERY  ? ? ?Allergies  ?Allergen Reactions  ? Clindamycin Rash  ? Sulfa Antibiotics Rash  ? ? ?Current Meds  ?Medication Sig  ? azelastine (ASTELIN) 0.1 % nasal spray Place 1-2 sprays into both nostrils daily as needed for rhinitis.  ? calcipotriene (DOVONOX) 0.005 % ointment Apply topically 2 (two) times daily.  ? Calcipotriene-Betameth Diprop (ENSTILAR) 0.005-0.064 % FOAM Apply topically.  ? Cholecalciferol (VITAMIN D) 125 MCG (5000 UT) CAPS Take 10,000 Units by mouth daily.  ? clobetasol cream (TEMOVATE) 0.05 % Apply 1 application. topically 2 (two) times daily.  ? COSENTYX SENSOREADY, 300 MG, 150 MG/ML SOAJ Inject 300 mg into the skin every 30 (thirty) days.  ? diltiazem (CARDIZEM CD) 240 MG 24 hr capsule Take 240 mg by mouth at bedtime.  ? DULoxetine (CYMBALTA) 60 MG capsule Take 60 mg by mouth daily.  ? gabapentin (NEURONTIN) 100 MG capsule Take 100 mg by mouth daily.  ? gabapentin (NEURONTIN) 300 MG capsule Take 300 mg by mouth at bedtime.  ? HYDROcodone-acetaminophen (NORCO/VICODIN) 5-325 MG tablet Take 1 tablet by mouth every 6 (six) hours as needed for moderate pain.  ? ibuprofen (ADVIL) 400 MG tablet Take 400 mg by mouth every 6 (six) hours as needed.  ? meclizine (ANTIVERT) 12.5 MG tablet Take 12.5 mg by mouth 3 (three) times daily as needed for dizziness.  ? metroNIDAZOLE (METROCREAM) 0.75 % cream Apply topically 2 (two) times daily.  ? Multiple Vitamin (MULTIVITAMIN ADULT  PO) Take by mouth.  ? pantoprazole (PROTONIX) 40 MG tablet Take 40 mg by mouth daily.  ? pravastatin (PRAVACHOL) 40 MG tablet Take 40 mg by mouth daily.  ? Roflumilast (ZORYVE) 0.3 % CREA Apply 1 application. topically at bedtime. Sample  ? ? ?Social History: ?Social History  ? ?Tobacco Use  ? Smoking status: Never  ? Smokeless tobacco: Never  ? ?Family Medical History: ?No  family history on file. ? ?Physical Examination: ? ?Vitals:  ? 03/22/22 1135  ?BP: (!) 147/102  ?Pulse: 86  ?Temp: 98 ?F (36.7 ?C)  ?SpO2: 97%  ? ? ?Heart sounds normal no MRG. Chest Clear to Auscultation Bilaterally. ? ?General: Patient is well developed, well nourished, calm, collected, and in no apparent distress. Attention to examination is appropriate. ? ?Psychiatric: Patient is non-anxious. ? ?Head: Pupils equal, round, and reactive to light. ? ?ENT: Oral mucosa appears well hydrated. ? ?Neck: Supple. Full range of motion. ? ?Respiratory: Patient is breathing without any difficulty. ? ?Extremities: No edema. ? ?Vascular: Palpable dorsal pedal pulses. ? ?Skin: On exposed skin, there are no abnormal skin lesions. ? ?NEUROLOGICAL:  ? ?Awake, alert, oriented to person, place, and time. Speech is clear and fluent. Fund of knowledge is appropriate.  ? ?Cranial Nerves: Pupils equal round and reactive to light. Facial tone is symmetric. Facial sensation is symmetric. Shoulder shrug is symmetric. Tongue protrusion is midline. There is no pronator drift. ? ?Strength: ?Side Biceps Triceps Deltoid Interossei Grip Wrist Ext. Wrist Flex.  ?R 5 5 5 5 5 5 5   ?L 5 5 5 5 5 5 5   ? ?Side Iliopsoas Quads Hamstring PF DF EHL  ?R 5 5 5 5 5 5   ?L 5 5 5 5 5 5   ? ?Reflexes are 1+ and symmetric at the biceps, triceps, brachioradialis, patella and achilles. Hoffman's is absent.  ?Clonus is not present. Toes are down-going.  ?Bilateral upper and lower extremity sensation is intact to light touch.  ?No evidence of dysmetria noted. ? ?Gait is normal.  ? ?Medical Decision Making ? ?Imaging: ?MRI L spine 12/08/2021 ?Impression ? ?1. 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  ? ?2. Left paramedian disc extrusion with cranial migration at T12-L1. Facet arthropathy and ligamentum flavum buckling resulting in mild central canal narrowing and severe left foraminal  narrowing. No right foraminal stenosis. ? ?I have personally reviewed the images and agree with the above interpretation. ? ?CT L spine 02/11/22 ?T12-L1: Severe loss of disc height with degenerative disc disease.  ?Disc osteophyte complex causes effacement of the anterior thecal sac  ?and left lateral recess.  ? ?L1-2: Degenerative disc disease with decreased disc height. Diffuse  ?bulging disc annulus cause effacement of the anterior thecal sac  ?with possible left paracentral protrusion causing effacement the  ?left lateral recess.  ? ?L2-3: Loss of disc height. Moderate central stenosis caused by disc  ?osteophyte complex and posterior facet joint hypertrophy.  ? ?L3-4: Severe loss of disc height with degenerative disc disease.  ?Prominent posterior osteophyte. Disc osteophyte complex combines  ?with posterior facet joint hypertrophy to produce severe central  ?canal stenosis.  ? ?L4-5: And intervertebral disc prosthesis is present. Postoperative  ?posterior laminectomy. No significant central stenosis.  ? ?L5-S1: Diffuse bulging disc annulus without significant central  ?stenosis. Posterior laminectomies.  ? ?IMPRESSION:  ?1. Postoperative changes with posterior laminectomies at L4-5 and  ?L5-S1. 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  ?2. Multilevel degenerative  changes as discussed above.  ? ?Electronically Signed  ?  By: Burman NievesWilliam  Stevens M.D.  ?  On: 02/11/2022 22:34 ? ?Assessment and Plan: ?Katie Hodges is a pleasant 81 y.o. 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 ? ?She previously had surgery at L3-4. I have recommended L2-3 lateral interbody fusion with extension of fusion to L2 with a left-sided T12-L1 decompression.  ? ?I discussed the planned procedure at length with the patient, including the risks, benefits, alternatives, and indications. The risks discussed include but are not limited to bleeding, infection, need for reoperation, spinal fluid leak, stroke, vision  loss, anesthetic complication, coma, paralysis, and even death. I also described in detail that improvement was not guaranteed. ? ?The patient expressed understanding of these risks, and asked that we proceed with sur

## 2022-03-22 NOTE — Assessment & Plan Note (Addendum)
Resume home CPAP

## 2022-03-22 NOTE — Assessment & Plan Note (Addendum)
Continue PPI ?

## 2022-03-22 NOTE — Op Note (Signed)
Indications: Ms. Katie Hodges is a 81 yo female who presented with: ?Lumbar radiculopathy M54.16, Chronic bilateral low back pain with bilateral sciatica M54.42, M54.41, G89.29 ? ?She failed conservative management prompting surgical intervention. ? ?Findings: stenosis ? ?Preoperative Diagnosis: Lumbar radiculopathy M54.16, Chronic bilateral low back pain with bilateral sciatica M54.42, M54.41, G89.29 ?Postoperative Diagnosis: same ? ? ?EBL: 150 ml ?IVF: see AR ml ?Drains: none ?Disposition: Extubated and Stable to PACU ?Complications: none ? ?A foley catheter was placed. ? ? ?Preoperative Note:  ? ?Risks of surgery discussed include: infection, bleeding, stroke, coma, death, paralysis, CSF leak, nerve/spinal cord injury, numbness, tingling, weakness, complex regional pain syndrome, recurrent stenosis and/or disc herniation, vascular injury, development of instability, neck/back pain, need for further surgery, persistent symptoms, development of deformity, and the risks of anesthesia. The patient understood these risks and agreed to proceed. ? ?NAME OF ANTERIOR PROCEDURE:               ?1. Anterior lumbar interbody fusion via a left lateral retroperitoneal approach at L2/3 ?2. Placement of a Lordotic Globus Hedron interbody cage, filled with Demineralized Bone Matrix ? ? ? ?NAME OF POSTERIOR PROCEDURE: ?1. Posterior instrumentation using Globus Creo Instrumentation ?2. Posterolateral fusion, L2-4  ?3. Posterior decompression, T12-L1  ? ? ?PROCEDURE:  Patient was brought to the operating room, intubated, turned to the lateral position.  All pressure points were checked and double-checked.  The patient was prepped and draped in the standard fashion. Prior to prepping, fluoroscopy was brought in and the patient was positioned with a large bump under the contralateral side between the iliac crest and rib cage, allowing the area between the iliac crest and the lateral aspect of the rib cage to open and increase the ability to  reach inferiorly, to facilitate entry into the disc space.  The incision was marked upon the skin both the location of the disc space as well as the superior most aspect of the iliac crest.  Based on the identification of the disc space an incision was prepared, marked upon the skin and eventually was used for our lateral incision.  The fluoroscopy was turned into a cross table A/P image in order to confirm that the patient?s spine remained in a perpendicular trajectory to the floor without rotation.  Once confirming that all the pressure points were checked and double-checked and the patient remained in sturdy position strapped down in this slightly jack-knifed lateral position, the patient was prepped and draped in standard fashion. ? ?The skin was injected with local anesthetic, then incised until the abdominal wall fascia was noted.  I bluntly dissected posteriorly until we were able to identify the posterior musculature near petit?s triangle.  At this point, using primarily blunt dissection with our finger aided with a metzenbaum scissor, were able to enter the retroperitoneal cavity.  The retroperitoneal potential space was opened further until palpating out the psoas muscle, the medial aspect of the iliac crest, the medial aspect of the last rib and continued to define the retroperitoneal space with blunt dissection in order to facilitate safe placement of our dilators.   ? ?While protecting by dissecting directly onto a finger in the retroperitoneum, the retroperitoneal space was entered safely from the lateral incision and the initial dilator placed onto the muscle belly of the psoas.  While directly stimulating the dilator and after radiographically confirming our location relative to the disc space, I placed the dilator through the psoas.  The dilators were stimulated to ensure remaining safely away  from any of the lumbar plexus nerves; the dilators were repositioned until no pathologic stimulation was  appreciated.  Once I had confirmed the location of our initial dilator radiographically, a K-wire secured the dilator into the L2/3 disc space and confirmed position under A/P and lateral fluoroscopy.  At this point, I dilated up with direct stimulation to confirm lack of pathologic stimulation. ? ?Once all the dilators were in position, I placed in the retractor and secured it onto the table, locked into position and confirmed under A/P and lateral fluoroscopy to confirm our approach angle to the disc space as well as location relative to the disc space.  I then placed the muscle stimulator in through the working channel down to the vertebral body, stimulating the entire lateral surface of the vertebral body and any of the visualized psoas muscle that was adjacent to the retractor, confirming again the safe passage to the psoas before we began performing the discectomy.  At this point, we began our discectomy at L2/3. ? ?The disc was incised laterally throughout the extent of our exposure. Using a combination of pituitary rongeurs, Kerrison rongeurs, rasps, curettes of various sorts, we were able to begin to clean out the disc space.  Once we had cleaned out the majority of the disc space, we then cut the lateral annulus with a cob, breaking the lateral annual attachments on the contralateral side by subtly working the cob through the annulus while using flouroscopy.  Care was taken not to extend further than required after cutting the annular attachments.  After this had been performed, we prepared the endplates for placement of our graft, sized a graft to the disc space by serially dilating up in trial sizes until we confirmed that our graft would be well positioned, allowing distraction while maintaining good grip.  This was confirmed under A/P and lateral fluoroscopy in order to ensure its placement as an eventual trial for placement of our final graft.  We irrigated with bacteriostatic saline.  Once confirmed  placement, the Hedron implant filled with allograft was impacted into position at L2/3. ? ? ?Through a combination of intradiscal distraction and anterior releasing, we were able to correct the anterior deformity during disc preparation and placement of the graft. ? ?At this point, final radiographs were performed, and we began closure.  The wound was closed using 0 Vicryl interrupted suture in the fascia and 2-0 Vicryl inverted suture were placed in the subcutaneous tissue and dermis. 3-0 monocryl was used for final closure. Dermabond was used to close the skin.   ? ?After closing the anterior part in layers, the patient was repositioned into prone position.  All pressure points were checked and double-checked. ? ?The posterior operative site was prepped and draped in standard fashion.  The stereotactic array was placed.  Stereotactic images were acquired using intraoperative CT scanning.  This was registered to the patient.  Using stereotaxis, screw trajectories were planned and incisions made.  The pedicles from L2 to L4 were cannulated bilaterally and K wires used to secure the tracks.  We then utilized a stereotactic screwdriver to place pedicle screws from L2 to L4. ? ?At each level, 6.5x57mm Globus Creo pedicle screws were placed. ? ?Once the screws were placed, the screw extensions were then linked, a path was formed for the rod and a rod was utilized to connect the screws.  We then compressed, torqued / counter-torqued and removed the screw assembly. Once performed on each side, the C-arm was brought  back and to take confirmatory CT scan showing appropriate placement of all instrumentation and anatomic alignment.   ? ?Posterolateral arthrodesis was performed at L2-L4. ? ?After performing the fusion, the metrx tubes were sequentially advanced and confirmed in position at T12-L1. An 52mm by 70mm tube was locked in place to the bed side attachment.  The image guidance system was used to confirm placement of  the tube. The microscope was then sterilely brought into the field and muscle creep was hemostased with a bipolar and resected with a pituitary rongeur.  A Bovie extender was then used to expose the spino

## 2022-03-22 NOTE — Assessment & Plan Note (Addendum)
Secondary to hypoxia and opiate related respiratory depression.  Responded to IV Narcan.   Now resolved. ?

## 2022-03-22 NOTE — Assessment & Plan Note (Addendum)
Continue her ENSTILAR foam. ?

## 2022-03-22 NOTE — Assessment & Plan Note (Addendum)
In normal sinus rhythm. ?- Eliquis is being held off for operative intervention. ?- It can be resumed.  Dr. Marcell Barlow is surgically appropriate. ?

## 2022-03-22 NOTE — Assessment & Plan Note (Addendum)
Uncontrolled, Hbg A1c is 8.9%. ?Appears not on diabetic meds outpatient. ?--Supplemental coverage with NovoLog with moderate intensity. ?--Continue Neurontin for peripheral neuropathy. ?--Consider starting on metformin ?--Close PCP follow up ?

## 2022-03-22 NOTE — Assessment & Plan Note (Addendum)
Management as per Dr. Marcell Barlow. ?- Caution with narcotics for analgesia ?--Recommend multi-modal pain control to minimize need for opiates ?--Resumed scheduled Tylenol  ?--Pain mgmt per primary team ?

## 2022-03-23 DIAGNOSIS — J9601 Acute respiratory failure with hypoxia: Secondary | ICD-10-CM | POA: Diagnosis not present

## 2022-03-23 LAB — GLUCOSE, CAPILLARY
Glucose-Capillary: 197 mg/dL — ABNORMAL HIGH (ref 70–99)
Glucose-Capillary: 200 mg/dL — ABNORMAL HIGH (ref 70–99)
Glucose-Capillary: 166 mg/dL — ABNORMAL HIGH (ref 70–99)
Glucose-Capillary: 203 mg/dL — ABNORMAL HIGH (ref 70–99)
Glucose-Capillary: 189 mg/dL — ABNORMAL HIGH (ref 70–99)

## 2022-03-23 LAB — HEMOGLOBIN A1C
Hgb A1c MFr Bld: 8.9 % — ABNORMAL HIGH (ref 4.8–5.6)
Mean Plasma Glucose: 208.73 mg/dL

## 2022-03-23 MED ORDER — MELOXICAM 7.5 MG PO TABS
15.0000 mg | ORAL_TABLET | Freq: Every day | ORAL | Status: DC
  Administered 2022-03-23: 15 mg via ORAL
  Filled 2022-03-23 (×3): qty 2

## 2022-03-23 MED ORDER — TRAZODONE HCL 100 MG PO TABS: 100.0000 mg | ORAL_TABLET | Freq: Every evening | ORAL | Status: DC | PRN

## 2022-03-23 MED ORDER — INSULIN GLARGINE-YFGN 100 UNIT/ML ~~LOC~~ SOLN
7.0000 [IU] | Freq: Every day | SUBCUTANEOUS | Status: DC
  Administered 2022-03-23 – 2022-03-24 (×2): 7 [IU] via SUBCUTANEOUS
  Filled 2022-03-23 (×2): qty 0.07

## 2022-03-23 MED ORDER — INSULIN ASPART 100 UNIT/ML IJ SOLN
0.0000 [IU] | Freq: Three times a day (TID) | INTRAMUSCULAR | Status: DC
  Administered 2022-03-23 – 2022-03-25 (×6): 3 [IU] via SUBCUTANEOUS
  Filled 2022-03-23 (×5): qty 1

## 2022-03-23 NOTE — Progress Notes (Addendum)
?Progress Note ? ? ?Patient: Katie Hodges VFI:[id number] DOB: 08-16-1941 DOA: 03/22/2022     2 ?DOS: the patient was seen and examined on 03/24/2022 ?  ?Brief hospital course: ?[employer] for Medical Management due to hypoxia and decreased responsiveness post-operatively. ? ? ?"Katie Hodges is a 81 y.o. Caucasian female with medical history significant for hypertension, GERD hypertension, type 2 diabetes mellitus, paroxysmal atrial fibrillation on Eliquis, OSA on home CPAP, depression and psoriasis, who underwent post lateral fusion L2-4 and posterior decompression T12-L1 and postoperatively upon arrival to her floor bed started having decreased responsiveness with respiratory compromise and inability to breathe.  She was helped to an Ambu bag and was bagged.  She was responsive to sternal rub then but unable to follow commands.  Rapid response team was called.  The patient was later breathing on her own and was changed to nonrebreather.  She received a few doses of Dilaudid earlier postoperatively.  She was given IV Narcan and she woke up more starting to move arms more and was pushing staff away.  She was then taken to the ICU.  With improvement of her breathing status her O2 was downgraded to 2 L/min of nasal cannula.  She was then responsive to her name though mildly somnolent.  She denies any preceding dyspnea or cough or wheezing.  No fever or chills.  No chest pain or palpitations.  No nausea or vomiting or abdominal pain.  She uses CPAP nightly for her OSA.  She denies any history of COPD or asthma.  No dysuria, oliguria or hematuria or flank pain.  No fever or chills. ? ?ED Course: BP was 157/90 with pulse symmetry of 90% on 3 0 O2 nasal cannula.  Labs revealed CBC with WBC of 13.3 and blood glucose was 291 and later 304. ?  ?Imaging: Lumbar spine x-ray showed the following: ?Limited intraoperative fluoroscopic views of the lumbar spine as ?described. Imaging displays rod and pedicle screw  fixation spanning ?L2 through L4 with placement of interbody cage at L2-3." ? ? ?4/22: mental status at baseline, weaned to 2 L/min [place] O2 and later to room air with stable O2 sats.   ? ?Patient will be medically stable for discharge once weaned off oxygen, PT/OT needs determined, pain controlled with PO meds. ?Resume Eliquis per Neurosurgery. ? ?Assessment and Plan: ?* Acute respiratory failure with hypoxia (HCC) ?Multifactorial secondary to opiates as well as OSA with CPAP requirement. ?--supplement O2 per protocol ?--maintain sats >90% ? ?Acute encephalopathy ?Secondary to hypoxia and opiate related respiratory depression.  Responded to IV Narcan.   Now resolved. ? ?Uncontrolled type 2 diabetes mellitus with hyperglycemia, without long-term current use of insulin (HCC) ?Uncontrolled, Hbg A1c is 8.9%. ?Appears not on diabetic meds outpatient. ?--Supplemental coverage with NovoLog with moderate intensity. ?--Continue Neurontin for peripheral neuropathy. ?--Consider starting on metformin ?--Close PCP follow up ? ?S/P lumbar fusion ?Management as per Dr. Marcell Barlow. ?- Caution with narcotics for analgesia ?--Recommend multi-modal pain control to minimize need for opiates ?- As needed IV Toradol can be used as appropriate from surgical standpoint. ? ?Obstructive sleep apnea ?Resume home CPAP ? ?Paroxysmal atrial fibrillation (HCC) ?In normal sinus rhythm. ?- Eliquis is being held off for operative intervention. ?- It can be resumed.  Dr. Marcell Barlow is surgically appropriate. ? ?GERD (gastroesophageal reflux disease) ?Continue PPI  ? ?Dyslipidemia ?Continue statin ? ?Essential hypertension ?Continue Cardizem CD. ? ?Psoriasis ?Continue her ENSTILAR foam. ? ?Depression ?continue Cymbalta ? ? ? ? ?  ? ?  Subjective: Pt seen in ICU with sister at bedside this AM.  Reports feeling well other than back pain post-op.  No other acute complaints. ? ?Physical Exam: ?Vitals:  ? 03/23/22 2035 03/24/22 0317 03/24/22 0436 03/24/22 0810   ?BP: 130/71 (!) 165/87 116/60 (!) 147/80  ?Pulse: 88 (!) 108 89 85  ?Resp: 20 20 20    ?Temp: 98.8 ?F (37.1 ?C) 98.1 ?F (36.7 ?C) 98.6 ?F (37 ?C) 98.2 ?F (36.8 ?C)  ?TempSrc: Oral Oral    ?SpO2: 96% 97% 92% 93%  ?Weight:      ?Height:      ? ?General exam: awake, alert, no acute distress ?HEENT: moist mucus membranes, hearing grossly normal  ?Respiratory system: CTAB, no wheezes, rales or rhonchi, normal respiratory effort. ?Cardiovascular system: normal S1/S2, RRR, no JVD, murmurs, rubs, gallops, no pedal edema.   ?Gastrointestinal system: soft, NT, ND, no HSM felt, +bowel sounds. ?Central nervous system: A&O x3. no gross focal neurologic deficits, normal speech ?Extremities: moves all, no edema, normal tone ?Skin: dry, intact, normal temperature ?Psychiatry: normal mood, congruent affect, judgement and insight appear normal ? ? ?Data Reviewed: ? ?Notable labs: WBC 13.3 likely reactive, CBG's 258>>197>>200>>166>>203.  Hbg A1c 8.9 % uncontrolled ? ?Family Communication: sister at bedside ? ?Disposition: ?Status is: Inpatient ?Remains inpatient appropriate because: d/c per neurosurgery, post-op PT/OT evaluations pending ? ? Planned Discharge Destination: Home with Home Health vs SNT pending PT/OT evaluation ? ? ? ?Time spent: 35 minutes ? ?Author: ? , DO ?03/24/2022 8:46 AM ? ?For on call review [email or web address].  ?

## 2022-03-23 NOTE — Evaluation (Signed)
Physical Therapy Evaluation ?Patient Details ?Name: Katie Hodges ?MRN: [id number] ?DOB: 06/15/1941 ?Today's Date: 03/23/2022 ? ?History of Present Illness ? Pt admitted for L2-L4 spinal fusion on 4/21. S/p sx, complications from acute respiratory failure with hypoxia noted with transfer to CCU. Decreased responsiveness and RR called. HIstory includes GERD, HTN, and DM. Cleared for evaluation this date  ?Clinical Impression ? Pt is a pleasant 81 year old female who was admitted for L2-L4 back fusion complicated by RR and CCU stay secondary to acute respiratory failure with hypoxia. Pt performs bed mobility with cga, transfers with min assist, and ambulation with cga and RW. Pt demonstrates deficits with strength/mobility/pain. Educated on back precautions. Still awaiting arrival of LSO, therefore limited mobility performed this date.All mobility performed on RA with sats WNL at 92%. Would benefit from skilled PT to address above deficits and promote optimal return to PLOF. Recommend transition to HHPT upon discharge from acute hospitalization. ? ?   ? ?Recommendations for follow up therapy are one component of a multi-disciplinary discharge planning process, led by the attending physician.  Recommendations may be updated based on patient status, additional functional criteria and insurance authorization. ? ?Follow Up Recommendations Home health PT ? ?  ?Assistance Recommended at Discharge Intermittent Supervision/Assistance  ?Patient can return home with the following ? A little help with walking and/or transfers;A little help with bathing/dressing/bathroom;Help with stairs or ramp for entrance;Assist for transportation ? ?  ?Equipment Recommendations Rolling walker (2 wheels)  ?Recommendations for Other Services ?    ?  ?Functional Status Assessment Patient has had a recent decline in their functional status and demonstrates the ability to make significant improvements in function in a reasonable and predictable  amount of time.  ? ?  ?Precautions / Restrictions Precautions ?Precautions: Fall;Back ?Precaution Booklet Issued: No ?Required Braces or Orthoses: Spinal Brace ?Spinal Brace: Applied in sitting position (brace not in room currently, per order can ambulate to bathroom without brace) ?Restrictions ?Weight Bearing Restrictions: No  ? ?  ? ?Mobility ? Bed Mobility ?Overal bed mobility: Needs Assistance ?Bed Mobility: Supine to Sit ?  ?  ?Supine to sit: Min guard ?  ?  ?General bed mobility comments: cues for back precautions and log roll. ONce seated at EOB, upright posture noted. No dizziness ?  ? ?Transfers ?Overall transfer level: Needs assistance ?Equipment used: Rolling walker (2 wheels) ?Transfers: Sit to/from Stand ?Sit to Stand: Min assist ?  ?  ?  ?  ?  ?General transfer comment: several reps with improving from min assist to CGA. RW used ?  ? ?Ambulation/Gait ?Ambulation/Gait assistance: Min guard ?Gait Distance (Feet): 10 Feet ?Assistive device: Rolling walker (2 wheels) ?Gait Pattern/deviations: Step-to pattern ?  ?  ?  ?General Gait Details: brace not in room at this time. RW used for mobility with cues for sequencing. Distance limited by fatigue. SLight drag in L LE noted ? ?Stairs ?  ?  ?  ?  ?  ? ?Wheelchair Mobility ?  ? ?Modified Rankin (Stroke Patients Only) ?  ? ?  ? ?Balance Overall balance assessment: Needs assistance ?Sitting-balance support: Feet supported ?Sitting balance-Leahy Scale: Good ?  ?  ?Standing balance support: Bilateral upper extremity supported ?Standing balance-Leahy Scale: Fair ?  ?  ?  ?  ?  ?  ?  ?  ?  ?  ?  ?  ?   ? ? ? ?Pertinent Vitals/Pain Pain Assessment ?Pain Assessment: 0-10 ?Pain Score: 4  ?Pain Location: low  back ?Pain Descriptors / Indicators: Operative site guarding ?Pain Intervention(s): Limited activity within patient's tolerance, Premedicated before session, Repositioned  ? ? ?Home Living Family/patient expects to be discharged to:: Private residence ?Living  Arrangements: Children (lives with son and grandchildren) ?Available Help at Discharge: Family;Available 24 hours/day ?Type of Home: House ?Home Access: Stairs to enter ?Entrance Stairs-Rails: Left;Right (wide) ?Entrance Stairs-Number of Steps: 3 ?  ?Home Layout: Two level;Able to live on main level with bedroom/bathroom ?Home Equipment: BSC/3in1 (bidet) ?   ?  ?Prior Function Prior Level of Function : Independent/Modified Independent ?  ?  ?  ?  ?  ?  ?Mobility Comments: reports history of falls. Household ambulator at baseline ?ADLs Comments: indep ?  ? ? ?Hand Dominance  ?   ? ?  ?Extremity/Trunk Assessment  ? Upper Extremity Assessment ?Upper Extremity Assessment: Overall WFL for tasks assessed ?  ? ?Lower Extremity Assessment ?Lower Extremity Assessment: Generalized weakness (L LE grossly 3/5; R LE grossly 4+/5) ?  ? ?   ?Communication  ? Communication: No difficulties  ?Cognition Arousal/Alertness: Awake/alert ?Behavior During Therapy: [facility] for tasks assessed/performed ?Overall Cognitive Status: Within Functional Limits for tasks assessed ?  ?  ?  ?  ?  ?  ?  ?  ?  ?  ?  ?  ?  ?  ?  ?  ?General Comments: pleasant and agreeable to participate ?  ?  ? ?  ?General Comments   ? ?  ?Exercises Other Exercises ?Other Exercises: ambulated to [facility] with min assist. ABle to void, RN aware. Supervision for hygiene  ? ?Assessment/Plan  ?  ?PT Assessment Patient needs continued PT services  ?PT Problem List Decreased strength;Decreased activity tolerance;Decreased balance;Decreased mobility;Pain ? ?   ?  ?PT Treatment Interventions DME instruction;Gait training;Stair training;Therapeutic exercise;Balance training   ? ?PT Goals (Current goals can be found in the Care Plan section)  ?Acute Rehab PT Goals ?Patient Stated Goal: to go home ?PT Goal Formulation: With patient ?Time For Goal Achievement: 04/06/22 ?Potential to Achieve Goals: Good ? ?  ?Frequency 7X/week ?  ? ? ?Co-evaluation   ?  ?  ?  ?  ? ? ?  ?AM-PAC PT "6 Clicks"  Mobility  ?Outcome Measure Help needed turning from your back to your side while in a flat bed without using bedrails?: None ?Help needed moving from lying on your back to sitting on the side of a flat bed without using bedrails?: A Little ?Help needed moving to and from a bed to a chair (including a wheelchair)?: A Little ?Help needed standing up from a chair using your arms (e.g., wheelchair or bedside chair)?: A Little ?Help needed to walk in hospital room?: A Little ?Help needed climbing 3-5 steps with a railing? : A Lot ?6 Click Score: 18 ? ?  ?End of Session Equipment Utilized During Treatment: Gait belt ?Activity Tolerance: Patient tolerated treatment well ?Patient left: in bed (left with nursing staff) ?Nurse Communication: Mobility status ?PT Visit Diagnosis: Unsteadiness on feet (R26.81);Muscle weakness (generalized) (M62.81);History of falling (Z91.81);Difficulty in walking, not elsewhere classified (R26.2);Pain ?Pain - Right/Left:  (bilat) ?Pain - part of body:  (back) ?  ? ?Time: 6283-6629 ?PT Time Calculation (min) (ACUTE ONLY): 29 min ? ? ?Charges:   PT Evaluation ?$PT Eval Low Complexity: 1 Low ?PT Treatments ?$Therapeutic Activity: 8-22 mins ?  ?   ? ? ?Elizabeth Palau, PT, DPT, GCS ?[phone] ? ? ?Katie Hodges ?03/23/2022, 2:12 PM ? ?

## 2022-03-23 NOTE — Hospital Course (Addendum)
[employer] [employer] for Medical Management due to hypoxia and decreased responsiveness post-operatively. ? ? ?"Katie Hodges is a 81 y.o. Caucasian female with medical history significant for hypertension, GERD hypertension, type 2 diabetes mellitus, paroxysmal atrial fibrillation on Eliquis, OSA on home CPAP, depression and psoriasis, who underwent post lateral fusion L2-4 and posterior decompression T12-L1 and postoperatively upon arrival to her floor bed started having decreased responsiveness with respiratory compromise and inability to breathe.  She was helped to an Ambu bag and was bagged.  She was responsive to sternal rub then but unable to follow commands.  Rapid response team was called.  The patient was later breathing on her own and was changed to nonrebreather.  She received a few doses of Dilaudid earlier postoperatively.  She was given IV Narcan and she woke up more starting to move arms more and was pushing staff away.  She was then taken to the ICU.  With improvement of her breathing status her O2 was downgraded to 2 L/min of nasal cannula.  She was then responsive to her name though mildly somnolent.  She denies any preceding dyspnea or cough or wheezing.  No fever or chills.  No chest pain or palpitations.  No nausea or vomiting or abdominal pain.  She uses CPAP nightly for her OSA.  She denies any history of COPD or asthma.  No dysuria, oliguria or hematuria or flank pain.  No fever or chills. ? ?ED Course: BP was 157/90 with pulse symmetry of 90% on 3 0 O2 nasal cannula.  Labs revealed CBC with WBC of 13.3 and blood glucose was 291 and later 304. ?  ?Imaging: Lumbar spine x-ray showed the following: ?Limited intraoperative fluoroscopic views of the lumbar spine as ?described. Imaging displays rod and pedicle screw fixation spanning ?L2 through L4 with placement of interbody cage at L2-3." ? ? ?4/22: mental status at baseline, weaned to 2 L/min [place] O2 and later to room air with stable O2 sats.    ? ?Patient will be medically stable for discharge once [facility] PT/OT arranged, pain controlled with PO meds.  Resume Eliquis per Neurosurgery. ?

## 2022-03-23 NOTE — Progress Notes (Signed)
? ?   Attending Progress Note ? ?History: Katie Hodges is here for surgical intervention on her back. ? ?POD1: Doing well.  She had overnarcotization last night and had RRT around 7 pm.  She has recovered from that. ? ?Physical Exam: ?Vitals:  ? 03/23/22 1000 03/23/22 1100  ?BP: (!) 145/86 130/76  ?Pulse: 86 82  ?Resp: 15 11  ?Temp:    ?SpO2: 100% 99%  ? ? ?AA Ox3 ?CNI ? ?Strength:5/5 throughout BLE ?Incisions c/d/i ? ?Data: ? ?Recent Labs  ?Lab 03/22/22 ?1209 03/22/22 ?2020  ?NA 141  --   ?K 4.1  --   ?CL 102  --   ?BUN 14  --   ?CREATININE 0.60 0.59  ?GLUCOSE 156*  --   ? ?No results for input(s): AST, ALT, ALKPHOS in the last 168 hours. ? ?Invalid input(s): TBILI  ? Recent Labs  ?Lab 03/22/22 ?2020  ?WBC 13.3*  ?HGB 13.5  ?HCT 41.2  ?PLT 202  ? ?No results for input(s): APTT, INR in the last 168 hours.  ?   ? ? ?Other tests/results: n/a ? ?Assessment/Plan: ? ?Katie Hodges is doing well after L2-3 XLIF and T12-L1 PSD. ? ?- mobilize ?- pain control ?- DVT prophylaxis ?- PTOT ?- Appreciate Medicine involvement in her care. ? ? ?Venetia Night MD, MPHS ?Department of Neurosurgery ? ? ? ?

## 2022-03-23 NOTE — Evaluation (Addendum)
Occupational Therapy Evaluation ?Patient Details ?Name: Katie Hodges ?MRN: [id number] ?DOB: 08/08/1941 ?Today's Date: 03/23/2022 ? ? ?History of Present Illness Pt is a 81 y.o.female who was admitted for L2-L4 spinal fusion on 4/21. S/p sx, complications from acute respiratory failure with hypoxia noted with transfer to CCU. Decreased responsiveness and RR called. iIstory includes GERD, HTN, and DM. Cleared for evaluation this date  ? ?Clinical Impression ?  ?Pt. Presents with weakness, pain,and limited functional mobility which hinder her ability to complete ADL, and IADL tasks. Pt. Resides at home with her son, and 2 grandchildren. Pt. Was independent with ADLs, and IADL functioning: including meal preparation, and medication management. Pt. Education was provided about log rolling when performing supine to sit to the EOB with Min guard. Pt. Performed sit to stand with minA. ModA for LE dressing donning panties, and pants with A/E, min guard to stand and perform clothing negotiation. Pt. will benefit from OT services for ADL training, A/E training, and pt. Education about home modification, and DME. Pt. Plans to return home upon discharge with family to assist pt. as needed. Pt. Will  benefit from follow-up [place] services upon discharge.  ?   ? ?Recommendations for follow up therapy are one component of a multi-disciplinary discharge planning process, led by the attending physician.  Recommendations may be updated based on patient status, additional functional criteria and insurance authorization.  ? ?Follow Up Recommendations ? Home health OT ?  ?Assistance Recommended at Discharge    ?Patient can return home with the following A little help with walking and/or transfers;A lot of help with bathing/dressing/bathroom;Assistance with cooking/housework ? ?  ?Functional Status Assessment ? Patient has had a recent decline in their functional status and demonstrates the ability to make significant improvements in  function in a reasonable and predictable amount of time.  ?Equipment Recommendations ? BSC/3in1  ?  ?Recommendations for Other Services   ? ? ?  ?Precautions / Restrictions Precautions ?Precautions: Back;Fall ?Precaution Booklet Issued: No ?Required Braces or Orthoses: Spinal Brace ?Spinal Brace:  (Brace not present in room yet. Per order can ambulate to the bathroom without the brace.) ?Restrictions ?Weight Bearing Restrictions: No  ? ?  ? ?Mobility Bed Mobility ?Overal bed mobility: Needs Assistance ?Bed Mobility: Supine to Sit ?  ?  ?Supine to sit: Min guard ?  ?  ?General bed mobility comments: Cues for log rolling technique ?  ? ?Transfers ?Overall transfer level: Needs assistance ?Equipment used: Rolling walker (2 wheels) ?Transfers: Sit to/from Stand ?Sit to Stand: Min assist ?  ?  ?  ?  ?  ?  ?  ? ?  ?Balance Overall balance assessment: Needs assistance ?  ?Sitting balance-Leahy Scale: Good ?  ?  ?  ?Standing balance-Leahy Scale: Fair ?  ?  ?  ?  ?  ?  ?  ?  ?  ?  ?  ?  ?   ? ?ADL either performed or assessed with clinical judgement  ? ?ADL Overall ADL's : Needs assistance/impaired ?Eating/Feeding: Set up;Independent ?  ?Grooming: Set up;Min guard ?  ?  ?  ?Lower Body Bathing: Set up;Moderate assistance ?  ?Upper Body Dressing : Set up;Minimal assistance ?  ?Lower Body Dressing: Set up;Moderate assistance ?  ?Toilet Transfer: Min guard ?  ?Toileting- Clothing Manipulation and Hygiene: Set up;Min guard ?  ?  ?  ?Functional mobility during ADLs: Min guard ?   ? ? ? ?Vision Baseline Vision/History: 1 Wears glasses ?Patient Visual  Report: No change from baseline ?   ?   ?Perception   ?  ?Praxis   ?  ? ?Pertinent Vitals/Pain Pain Assessment ?Pain Assessment: No/denies pain ? ?  ? ? ? ?Hand Dominance   ?  ?Extremity/Trunk Assessment Upper Extremity Assessment ?Upper Extremity Assessment: Overall WFL for tasks assessed ?  ?Lower Extremity Assessment ?Lower Extremity Assessment: Generalized weakness ?  ?  ?   ?Communication Communication ?Communication: No difficulties ?  ?Cognition Arousal/Alertness: Awake/alert ?Behavior During Therapy: [facility] for tasks assessed/performed ?Overall Cognitive Status: Within Functional Limits for tasks assessed ?  ?  ?  ?  ?  ?  ?  ?  ?  ?  ?  ?  ?  ?  ?  ?  ?  ?  ?  ?General Comments    ? ?  ?Exercises   ?  ?Shoulder Instructions    ? ? ?Home Living Family/patient expects to be discharged to:: Private residence ?Living Arrangements: Children ?Available Help at Discharge: Family;Available 24 hours/day ?Type of Home: House ?Home Access: Stairs to enter ?Entrance Stairs-Number of Steps: 3 ?Entrance Stairs-Rails: Left;Right (wide) ?Home Layout: Two level;Able to live on main level with bedroom/bathroom ?  ?  ?  ?  ?  ?  ?  ?Home Equipment: BSC/3in1 ?  ?  ?  ? ?  ?Prior Functioning/Environment Prior Level of Function : Independent/Modified Independent ?  ?  ?  ?  ?  ?  ?Mobility Comments: reports history of falls. Household ambulator at baseline ?ADLs Comments: indep ?  ? ?  ?  ?OT Problem List: Decreased strength ?  ?   ?OT Treatment/Interventions: Self-care/ADL training;DME and/or AE instruction;Patient/family education;Neuromuscular education  ?  ?OT Goals(Current goals can be found in the care plan section) Acute Rehab OT Goals ?Patient Stated Goal: To return home ?OT Goal Formulation: With patient ?Time For Goal Achievement: 04/06/22 ?Potential to Achieve Goals: Good  ?OT Frequency:   ?  ? ?Co-evaluation   ?  ?  ?  ?  ? ?  ?AM-PAC OT "6 Clicks" Daily Activity     ?Outcome Measure Help from another person eating meals?: None ?Help from another person taking care of personal grooming?: A Little ?Help from another person toileting, which includes using toliet, bedpan, or urinal?: A Little ?Help from another person bathing (including washing, rinsing, drying)?: A Lot ?Help from another person to put on and taking off regular upper body clothing?: A Little ?Help from another person to put on  and taking off regular lower body clothing?: A Lot ?6 Click Score: 17 ?  ?End of Session Equipment Utilized During Treatment: Gait belt ? ?Activity Tolerance: Patient tolerated treatment well ?Patient left: in bed ? ?OT Visit Diagnosis: Unsteadiness on feet (R26.81);Muscle weakness (generalized) (M62.81)  ?              ?Time: [id number] ?OT Time Calculation (min): 36 min ?Charges:  OT General Charges ?$OT Visit: 1 Visit ?OT Evaluation ?$OT Eval Moderate Complexity: 1 Mod ? ?Harrel Carina, MS, OTR/L  ? ?Harrel Carina ?03/23/2022, 5:04 PM ?

## 2022-03-24 DIAGNOSIS — J9601 Acute respiratory failure with hypoxia: Secondary | ICD-10-CM | POA: Diagnosis not present

## 2022-03-24 LAB — GLUCOSE, CAPILLARY
Glucose-Capillary: 183 mg/dL — ABNORMAL HIGH (ref 70–99)
Glucose-Capillary: 160 mg/dL — ABNORMAL HIGH (ref 70–99)
Glucose-Capillary: 179 mg/dL — ABNORMAL HIGH (ref 70–99)
Glucose-Capillary: 186 mg/dL — ABNORMAL HIGH (ref 70–99)

## 2022-03-24 MED ORDER — PANTOPRAZOLE SODIUM 40 MG PO TBEC: 40.0000 mg | DELAYED_RELEASE_TABLET | Freq: Every day | ORAL | Status: DC

## 2022-03-24 MED ORDER — ALUM & MAG HYDROXIDE-SIMETH 200-200-20 MG/5ML PO SUSP
30.0000 mL | Freq: Four times a day (QID) | ORAL | Status: DC | PRN
  Administered 2022-03-24: 30 mL via ORAL
  Filled 2022-03-24: qty 30

## 2022-03-24 MED ORDER — ACETAMINOPHEN 500 MG PO TABS
1000.0000 mg | ORAL_TABLET | Freq: Three times a day (TID) | ORAL | Status: DC
  Administered 2022-03-24 – 2022-03-25 (×3): 1000 mg via ORAL
  Filled 2022-03-24 (×3): qty 2

## 2022-03-24 NOTE — Progress Notes (Signed)
Orthopedic Tech Progress Note ?Patient Details:  ?Katie Hodges ?1941/07/31 ?[id number] ? ?Patient ID: Katie Hodges, female   DOB: Feb 13, 1941, 81 y.o.   MRN: [id number] ?Placed order with [facility] for LSO. ? ?[place] ?03/24/2022, 8:26 AM ? ?

## 2022-03-24 NOTE — Plan of Care (Signed)

## 2022-03-24 NOTE — TOC Initial Note (Signed)
Transition of Care (TOC) - Initial/Assessment Note  ? ? ?Patient Details  ?Name: Katie Hodges ?MRN: [id number] ?Date of Birth: 26-Jan-1941 ? ?Transition of Care (TOC) CM/SW Contact:    ?Emeri Estill E Nesta Kimple, LCSW ?Phone Number: ?03/24/2022, 10:16 AM ? ?Clinical Narrative:                Spoke with patient regarding DC planning and PT/OT recs.  ?Patient lives with her son and 2 grandchildren (ages 51 and 70). Confirmed home address in [place]. PCP is Dr. Mikey Bussing. Pharmacy is [facility]. Patient has a raised toilet seat at home. Patient drives herself to appointments, family can also transport if needed. ?Patient agreeable to [facility], RW, and 3in1 recs. No HH history or agency preference. Referral accepted by Elnita Maxwell with [facility]. Referral made to [facility] with [facility] for 3in1 and RW. Both agencies are aware of possible DC Monday.  ? ?  ?Barriers to Discharge: Continued Medical Work up ? ? ?Patient Goals and CMS Choice ?Patient states their goals for this hospitalization and ongoing recovery are:: home with home health ?CMS Medicare.gov Compare Post Acute Care list provided to:: Patient ?Choice offered to / list presented to : Patient ? ?Expected Discharge Plan and Services ?  ?  ?  ?  ?Living arrangements for the past 2 months: Single Family Home ?                ?DME Arranged: Walker rolling, 3-N-1 ?DME Agency: [facility] ?Date DME Agency Contacted: 03/24/22 ?  ?Representative spoke with at DME Agency: Leavy Cella ?HH Arranged: PT, OT ?HH Agency: [facility] ?Date HH Agency Contacted: 03/24/22 ?  ?Representative spoke with at [facility] Agency: Elnita Maxwell ? ?Prior Living Arrangements/Services ?Living arrangements for the past 2 months: Single Family Home ?Lives with:: Relatives, Adult Children ?Patient language and need for interpreter reviewed:: Yes ?Do you feel safe going back to the place where you live?: Yes      ?Need for Family Participation in Patient Care: Yes (Comment) ?Care giver support  system in place?: Yes (comment) ?Current home services: DME ?Criminal Activity/Legal Involvement Pertinent to Current Situation/Hospitalization: No - Comment as needed ? ?Activities of Daily Living ?  ?  ? ?Permission Sought/Granted ?Permission sought to share information with : Magazine features editor ?Permission granted to share information with : Yes, Verbal Permission Granted ?   ? Permission granted to share info w AGENCY: HH, DME agencies ?   ?   ? ?Emotional Assessment ?  ?  ?  ?Orientation: : Oriented to Self, Oriented to Place, Oriented to  Time, Oriented to Situation ?Alcohol / Substance Use: Not Applicable ?Psych Involvement: No (comment) ? ?Admission diagnosis:  S/P lumbar fusion [Z98.1] ?Patient Active Problem List  ? Diagnosis Date Noted  ? S/P lumbar fusion 03/22/2022  ? Acute respiratory failure with hypoxia (HCC) 03/22/2022  ? Acute encephalopathy 03/22/2022  ? Paroxysmal atrial fibrillation (HCC) 03/22/2022  ? Uncontrolled type 2 diabetes mellitus with hyperglycemia, without long-term current use of insulin (HCC) 03/22/2022  ? Depression 03/22/2022  ? Psoriasis 03/22/2022  ? Essential hypertension 03/22/2022  ? Dyslipidemia 03/22/2022  ? GERD (gastroesophageal reflux disease) 03/22/2022  ? Obstructive sleep apnea 03/22/2022  ? ?PCP:  Lindwood Qua, MD ?Pharmacy:   ?[id number] [place], [place] - [address] ?[address] ?[place] [address] ?Phone: [phone] Fax: [phone] ? ?[facility] - [place], [place] - [place], [place] [address] ?[address] ?[place] [address] ?Phone: [phone] Fax: [phone] ? ? ? ? ?Social Determinants of Health (SDOH) Interventions ?  ? ?Readmission Risk Interventions ? ?  03/24/2022  ? 10:14 AM  ?Readmission Risk Prevention Plan  ?Post Dischage Appt Complete  ?Medication Screening Complete  ?Transportation Screening Complete  ? ? ? ?

## 2022-03-24 NOTE — Progress Notes (Signed)
? ?   Attending Progress Note ? ?History: Katie Hodges is here for surgical intervention on her back. ? ?POD2: Doing well. Has some continued pain. ? ?POD1: Doing well.  She had overnarcotization last night and had RRT around 7 pm.  She has recovered from that. ? ?Physical Exam: ?Vitals:  ? 03/24/22 0436 03/24/22 0810  ?BP: 116/60 (!) 147/80  ?Pulse: 89 85  ?Resp: 20   ?Temp: 98.6 ?F (37 ?C) 98.2 ?F (36.8 ?C)  ?SpO2: 92% 93%  ? ? ?AA Ox3 ?CNI ? ?Strength:5/5 throughout BLE ?Incisions c/d/i ? ?Data: ? ?Recent Labs  ?Lab 03/22/22 ?1209 03/22/22 ?2020  ?NA 141  --   ?K 4.1  --   ?CL 102  --   ?BUN 14  --   ?CREATININE 0.60 0.59  ?GLUCOSE 156*  --   ? ?No results for input(s): AST, ALT, ALKPHOS in the last 168 hours. ? ?Invalid input(s): TBILI  ? Recent Labs  ?Lab 03/22/22 ?2020  ?WBC 13.3*  ?HGB 13.5  ?HCT 41.2  ?PLT 202  ? ?No results for input(s): APTT, INR in the last 168 hours.  ?   ? ? ?Other tests/results: n/a ? ?Assessment/Plan: ? ?Katie Hodges is doing well after L2-3 XLIF and T12-L1 PSD. ? ?- mobilize ?- pain control ?- DVT prophylaxis ?- PTOT ?- Appreciate Medicine involvement in her care. ? ? ?Venetia Night MD, MPHS ?Department of Neurosurgery ? ? ? ?

## 2022-03-24 NOTE — Progress Notes (Signed)
Physical Therapy Treatment ?Patient Details ?Name: Katie Hodges ?MRN: [id number] ?DOB: 08-Feb-1941 ?Today's Date: 03/24/2022 ? ? ?History of Present Illness Pt is a 81 y.o.female who was admitted for L2-L4 spinal fusion on 4/21. S/p sx, complications from acute respiratory failure with hypoxia noted with transfer to CCU. Decreased responsiveness and RR called. iIstory includes GERD, HTN, and DM. Cleared for evaluation this date ? ?  ?PT Comments  ? ? Pt is making good progress towards goals with ability to ambulate in hallway using RW. LSO donned at bedside and pt educated on back precautions along with written hand out given. Will need to perform stair training next date. Pt with good HR control throughout session, 94bpm with exertion. Will continue to progress.   ?Recommendations for follow up therapy are one component of a multi-disciplinary discharge planning process, led by the attending physician.  Recommendations may be updated based on patient status, additional functional criteria and insurance authorization. ? ?Follow Up Recommendations ? Home health PT ?  ?  ?Assistance Recommended at Discharge Intermittent Supervision/Assistance  ?Patient can return home with the following A little help with walking and/or transfers;A little help with bathing/dressing/bathroom;Help with stairs or ramp for entrance;Assist for transportation ?  ?Equipment Recommendations ? Rolling walker (2 wheels)  ?  ?Recommendations for Other Services   ? ? ?  ?Precautions / Restrictions Precautions ?Precautions: Back;Fall ?Precaution Booklet Issued: Yes (comment) ?Required Braces or Orthoses: Spinal Brace ?Spinal Brace: Applied in sitting position ?Restrictions ?Weight Bearing Restrictions: No  ?  ? ?Mobility ? Bed Mobility ?Overal bed mobility: Needs Assistance ?Bed Mobility: Supine to Sit ?  ?  ?Supine to sit: Min guard ?  ?  ?General bed mobility comments: safe technique with cues for log rolling. Once seated, LSO donned for  mobility ?  ? ?Transfers ?Overall transfer level: Needs assistance ?Equipment used: Rolling walker (2 wheels) ?Transfers: Sit to/from Stand ?Sit to Stand: Min guard ?  ?  ?  ?  ?  ?General transfer comment: improved compared to previous date. Cues for pushing from seated surface. Once standing, RW used ?  ? ?Ambulation/Gait ?Ambulation/Gait assistance: Min guard ?Gait Distance (Feet): 60 Feet ?Assistive device: Rolling walker (2 wheels) ?Gait Pattern/deviations: Step-through pattern ?  ?  ?  ?General Gait Details: ambulated in hallway with RW. Reciprocal gait pattern with cues for maintaining hip precautions during turns. Very slow antalgic gait pattern ? ? ?Stairs ?  ?  ?  ?  ?  ? ? ?Wheelchair Mobility ?  ? ?Modified Rankin (Stroke Patients Only) ?  ? ? ?  ?Balance Overall balance assessment: Needs assistance ?Sitting-balance support: Feet supported ?Sitting balance-Leahy Scale: Good ?  ?  ?Standing balance support: Bilateral upper extremity supported ?Standing balance-Leahy Scale: Fair ?  ?  ?  ?  ?  ?  ?  ?  ?  ?  ?  ?  ?  ? ?  ?Cognition Arousal/Alertness: Awake/alert ?Behavior During Therapy: [facility] for tasks assessed/performed ?Overall Cognitive Status: Within Functional Limits for tasks assessed ?  ?  ?  ?  ?  ?  ?  ?  ?  ?  ?  ?  ?  ?  ?  ?  ?General Comments: pleasant and eager to participate ?  ?  ? ?  ?Exercises   ? ?  ?General Comments   ?  ?  ? ?Pertinent Vitals/Pain Pain Assessment ?Pain Assessment: Faces ?Faces Pain Scale: Hurts little more ?Pain Location: low back ?Pain  Descriptors / Indicators: Operative site guarding ?Pain Intervention(s): Limited activity within patient's tolerance, RN gave pain meds during session, Repositioned  ? ? ?Home Living   ?  ?  ?  ?  ?  ?  ?  ?  ?  ?   ?  ?Prior Function    ?  ?  ?   ? ?PT Goals (current goals can now be found in the care plan section) Acute Rehab PT Goals ?Patient Stated Goal: to go home ?PT Goal Formulation: With patient ?Time For Goal Achievement:  04/06/22 ?Potential to Achieve Goals: Good ?Progress towards PT goals: Progressing toward goals ? ?  ?Frequency ? ? ? 7X/week ? ? ? ?  ?PT Plan Current plan remains appropriate  ? ? ?Co-evaluation   ?  ?  ?  ?  ? ?  ?AM-PAC PT "6 Clicks" Mobility   ?Outcome Measure ? Help needed turning from your back to your side while in a flat bed without using bedrails?: None ?Help needed moving from lying on your back to sitting on the side of a flat bed without using bedrails?: A Little ?Help needed moving to and from a bed to a chair (including a wheelchair)?: A Little ?Help needed standing up from a chair using your arms (e.g., wheelchair or bedside chair)?: A Little ?Help needed to walk in hospital room?: A Little ?Help needed climbing 3-5 steps with a railing? : A Lot ?6 Click Score: 18 ? ?  ?End of Session Equipment Utilized During Treatment: Gait belt ?Activity Tolerance: Patient tolerated treatment well ?Patient left: in chair;with family/visitor present ?Nurse Communication: Mobility status ?PT Visit Diagnosis: Unsteadiness on feet (R26.81);Muscle weakness (generalized) (M62.81);History of falling (Z91.81);Difficulty in walking, not elsewhere classified (R26.2);Pain ?Pain - Right/Left:  (bilat) ?Pain - part of body:  (back) ?  ? ? ?Time: 0102-7253 ?PT Time Calculation (min) (ACUTE ONLY): 26 min ? ?Charges:  $Gait Training: 23-37 mins          ?          ? ?Elizabeth Palau, PT, DPT, GCS ?[phone] ? ? ? ?[name] ?03/24/2022, 12:07 PM ? ?

## 2022-03-24 NOTE — Progress Notes (Signed)
?Progress Note ? ? ?Patient: Katie Hodges [id number] DOB: Nov 29, 1941 DOA: 03/22/2022     2 ?DOS: the patient was seen and examined on 03/24/2022 ?  ?Brief hospital course: ?[place] [facility] Management due to hypoxia and decreased responsiveness post-operatively. ? ? ?"Katie Hodges is a 81 y.o. Caucasian female with medical history significant for hypertension, GERD hypertension, type 2 diabetes mellitus, paroxysmal atrial fibrillation on Eliquis, OSA on home CPAP, depression and psoriasis, who underwent post lateral fusion L2-4 and posterior decompression T12-L1 and postoperatively upon arrival to her floor bed started having decreased responsiveness with respiratory compromise and inability to breathe.  She was helped to an Ambu bag and was bagged.  She was responsive to sternal rub then but unable to follow commands.  Rapid response team was called.  The patient was later breathing on her own and was changed to nonrebreather.  She received a few doses of Dilaudid earlier postoperatively.  She was given IV Narcan and she woke up more starting to move arms more and was pushing staff away.  She was then taken to the ICU.  With improvement of her breathing status her O2 was downgraded to 2 L/min of nasal cannula.  She was then responsive to her name though mildly somnolent.  She denies any preceding dyspnea or cough or wheezing.  No fever or chills.  No chest pain or palpitations.  No nausea or vomiting or abdominal pain.  She uses CPAP nightly for her OSA.  She denies any history of COPD or asthma.  No dysuria, oliguria or hematuria or flank pain.  No fever or chills. ? ?ED Course: BP was 157/90 with pulse symmetry of 90% on 3 0 O2 nasal cannula.  Labs revealed CBC with WBC of 13.3 and blood glucose was 291 and later 304. ?  ?Imaging: Lumbar spine x-ray showed the following: ?Limited intraoperative fluoroscopic views of the lumbar spine as ?described. Imaging displays rod and pedicle screw  fixation spanning ?L2 through L4 with placement of interbody cage at L2-3." ? ? ?4/22: mental status at baseline, weaned to 2 L/min [name] and later to room air with stable O2 sats.   ? ?Patient will be medically stable for discharge once weaned off oxygen, PT/OT needs determined, pain controlled with PO meds. ?Resume Eliquis per Neurosurgery. ? ?Assessment and Plan: ?* Acute respiratory failure with hypoxia ([name]) ?Multifactorial secondary to opiates as well as OSA with CPAP requirement. ?--supplement O2 per protocol ?--maintain sats >90% ? ?Acute encephalopathy ?Secondary to hypoxia and opiate related respiratory depression.  Responded to IV Narcan.   Now resolved. ? ?Uncontrolled type 2 diabetes mellitus with hyperglycemia, without long-term current use of insulin ([name]) ?Uncontrolled, Hbg A1c is 8.9%. ?Appears not on diabetic meds outpatient. ?--Supplemental coverage with NovoLog with moderate intensity. ?--Continue Neurontin for peripheral neuropathy. ?--Consider starting on metformin ?--Close PCP follow up ? ?S/P lumbar fusion ?Management as per Dr. Cari Caraway. ?- Caution with narcotics for analgesia ?--Recommend multi-modal pain control to minimize need for opiates ?--Resumed scheduled Tylenol  ?--Pain mgmt per primary team ? ?Obstructive sleep apnea ?Resume home CPAP ? ?Paroxysmal atrial fibrillation (H[facility]) ?In normal sinus rhythm. ?- Eliquis is being held off for operative intervention. ?- It can be resumed.  Dr. Cari Caraway is surgically appropriate. ? ?GERD (gastroesophageal reflux disease) ?Continue PPI  ? ?Dyslipidemia ?Continue statin ? ?Essential hypertension ?Continue Cardizem CD. ? ?Psoriasis ?Continue her ENSTILAR foam. ? ?Depression ?continue Cymbalta ? ? ? ? ?  ? ?Subjective:  Pt seen up in recliner wearing TSLO brace, sister at bedside. Pt reports some pain, less well-controlled than usual (pre-surgery).  Currently 7/10 pain about 2.5 hrs after getting oxycodone.  Otherwise no acute complaints.  Hopes  to go home tomorrow. ? ?Physical Exam: ?Vitals:  ? 03/24/22 0436 03/24/22 0810 03/24/22 1151 03/24/22 1152  ?BP: 116/60 (!) 147/80 (!) 145/79 (!) 145/79  ?Pulse: 89 85 81 80  ?Resp: 20     ?Temp: 98.6 ?F (37 ?C) 98.2 ?F (36.8 ?C) 98.6 ?F (37 ?C) 98.6 ?F (37 ?C)  ?TempSrc:      ?SpO2: 92% 93% 94% 92%  ?Weight:      ?Height:      ? ?General exam: sitting in recliner, awake, alert, no acute distress ?HEENT: moist mucus membranes, hearing grossly normal  ?Respiratory system: normal respiratory effort. On room air ?Cardiovascular system: normal S1/S2, RRR, no pedal edema.   ?Central nervous system: A&O x3. no gross focal neurologic deficits, normal speech ?Extremities: moves all, no edema, normal tone ?MSK: wearing TSLO brace ?Psychiatry: normal mood, congruent affect, judgement and insight appear normal ? ? ?Data Reviewed: ? ?Notable labs: CBGs overall at goal ? ?Family Communication: sister at bedside ? ?Disposition: ?Status is: Inpatient ?Remains inpatient appropriate because: d/c per neurosurgery, pending adequate pain control, home health services arranged ? ? Planned Discharge Destination: Home with Home Health vs SNT pending PT/OT evaluation ? ? ? ?Time spent: 35 minutes ? ?Author: ?Ezekiel Slocumb, DO ?03/24/2022 12:19 PM ? ?For on call review [email or web address].  ?

## 2022-03-25 ENCOUNTER — Encounter: Payer: Self-pay | Admitting: Neurosurgery

## 2022-03-25 LAB — BASIC METABOLIC PANEL
Anion gap: 6 (ref 5–15)
BUN: 25 mg/dL — ABNORMAL HIGH (ref 8–23)
CO2: 30 mmol/L (ref 22–32)
Calcium: 8.8 mg/dL — ABNORMAL LOW (ref 8.9–10.3)
Chloride: 105 mmol/L (ref 98–111)
Creatinine, Ser: 0.58 mg/dL (ref 0.44–1.00)
GFR, Estimated: >60 mL/min (ref >60)
Glucose, Bld: 157 mg/dL — ABNORMAL HIGH (ref 70–99)
Potassium: 3.7 mmol/L (ref 3.5–5.1)
Sodium: 141 mmol/L (ref 135–145)

## 2022-03-25 LAB — CBC
HCT: 36 % (ref 36.0–46.0)
Hemoglobin: 11.4 g/dL — ABNORMAL LOW (ref 12.0–15.0)
MCH: 31.4 pg (ref 26.0–34.0)
MCHC: 31.7 g/dL (ref 30.0–36.0)
MCV: 99.2 fL (ref 80.0–100.0)
Platelets: 186 10*3/uL (ref 150–400)
RBC: 3.63 MIL/uL — ABNORMAL LOW (ref 3.87–5.11)
RDW: 12.2 % (ref 11.5–15.5)
WBC: 12 10*3/uL — ABNORMAL HIGH (ref 4.0–10.5)
nRBC: 0 % (ref 0.0–0.2)

## 2022-03-25 LAB — GLUCOSE, CAPILLARY
Glucose-Capillary: 283 mg/dL — ABNORMAL HIGH (ref 70–99)
Glucose-Capillary: 151 mg/dL — ABNORMAL HIGH (ref 70–99)

## 2022-03-25 MED ORDER — LOSARTAN POTASSIUM 25 MG PO TABS: 25.0000 mg | ORAL_TABLET | Freq: Every day | ORAL | Status: DC

## 2022-03-25 MED ORDER — SENNA 8.6 MG PO TABS
1.0000 | ORAL_TABLET | Freq: Two times a day (BID) | ORAL | 0 refills | Status: DC
Start: 2022-03-25 — End: 2022-06-20

## 2022-03-25 MED ORDER — OXYCODONE HCL 5 MG PO TABS: 5.0000 mg | ORAL_TABLET | ORAL | 0 refills | Status: DC | PRN

## 2022-03-25 MED ORDER — METHOCARBAMOL 500 MG PO TABS: 500.0000 mg | ORAL_TABLET | Freq: Three times a day (TID) | ORAL | 0 refills | Status: DC | PRN

## 2022-03-25 MED ORDER — LOSARTAN POTASSIUM 25 MG PO TABS: 25.0000 mg | ORAL_TABLET | Freq: Every day | ORAL | 0 refills | Status: DC

## 2022-03-25 NOTE — Progress Notes (Signed)
? ?   Attending Progress Note ? ?History: Katie Hodges is here for surgical intervention on her back. ? ?POD3: feeling better this morning ? ?POD2: Doing well. Has some continued pain. ? ?POD1: Doing well.  She had overnarcotization last night and had RRT around 7 pm.  She has recovered from that. ? ?Physical Exam: ?Vitals:  ? 03/24/22 2013 03/25/22 0500  ?BP: (!) 143/75 (!) 143/73  ?Pulse: 85 85  ?Resp: 20 20  ?Temp: 98.3 ?F (36.8 ?C) 98 ?F (36.7 ?C)  ?SpO2: 91% 99%  ? ? ?Drowsy but arouses easily to voice.  ?CNI ? ?Strength:5/5 throughout BLE ?Incisions c/d/i ? ?Data: ? ?Recent Labs  ?Lab 03/25/22 ?0277  ?NA 141  ?K 3.7  ?CL 105  ?CO2 30  ?BUN 25*  ?CREATININE 0.58  ?GLUCOSE 157*  ?CALCIUM 8.8*  ? ? ?No results for input(s): AST, ALT, ALKPHOS in the last 168 hours. ? ?Invalid input(s): TBILI  ? Recent Labs  ?Lab 03/22/22 ?2020 03/25/22 ?4128  ?WBC 13.3* 12.0*  ?HGB 13.5 11.4*  ?HCT 41.2 36.0  ?PLT 202 186  ? ? ?No results for input(s): APTT, INR in the last 168 hours.  ?   ? ? ?Other tests/results: n/a ? ?Assessment/Plan: ? ?Beverely Pace Baynes is doing well after L2-3 XLIF and T12-L1 PSD. ? ?- mobilize ?- pain control ?- DVT prophylaxis ?- PTOT ?- Appreciate Medicine involvement in her care. ? ?Manning Charity PA-C ?Department of Neurosurgery ? ? ? ?

## 2022-03-25 NOTE — Progress Notes (Signed)
Occupational Therapy Treatment ?Patient Details ?Name: Katie Hodges ?MRN: [id number] ?DOB: 01-Nov-1941 ?Today's Date: 03/25/2022 ? ? ?History of present illness Pt is a 81 y.o.female who was admitted for L2-L4 spinal fusion on 4/21. S/p sx, complications from acute respiratory failure with hypoxia noted with transfer to CCU. Decreased responsiveness and RR called. iIstory includes GERD, HTN, and DM. Cleared for evaluation this date ?  ?OT comments ? Pt seen for OT tx this date.  Pt had back brace loosely on in the bed, secured tightly in supine.  Adjusted HOB all the way down and cued pt not to use hand rails to simulate home bed mobility.  Pt with good carryover with log rolling technique, requiring only min guard supine<>sit.  Pt tolerated amb with RW to bathroom and able to void, set up/supv for all toileting tasks, including management of clothing in standing.  Tolerated standing at sink for grooming activities with set up/supv.  OT reinforced back precautions with ADLs and education provided on 3 uses of 3in1 for home.  Left pt sitting up in chair with all needs met, all necessary items within reach at end of session.  HH OT continues to be appropriate.   ? ?Recommendations for follow up therapy are one component of a multi-disciplinary discharge planning process, led by the attending physician.  Recommendations may be updated based on patient status, additional functional criteria and insurance authorization. ?   ?Follow Up Recommendations ? Home health OT  ?  ?Assistance Recommended at Discharge    ?Patient can return home with the following ? A little help with walking and/or transfers;Assistance with cooking/housework;A little help with bathing/dressing/bathroom ?  ?Equipment Recommendations ? BSC/3in1  ?  ?Recommendations for Other Services   ? ?  ?Precautions / Restrictions Precautions ?Precautions: Back;Fall ?Required Braces or Orthoses: Spinal Brace ?Spinal Brace: Applied in supine  position ?Restrictions ?Weight Bearing Restrictions: No  ? ? ?  ? ?Mobility Bed Mobility ?Overal bed mobility: Needs Assistance ?Bed Mobility: Supine to Sit ?  ?  ?Supine to sit: Min guard ?  ?  ?General bed mobility comments: good carryover with log rolling technique.  Performed supine to sit with HOB flat and no use of rail to simulate bed at home. ?Patient Response: Cooperative ? ?Transfers ?Overall transfer level: Needs assistance ?Equipment used: Rolling walker (2 wheels) ?Transfers: Sit to/from Stand ?Sit to Stand: Min guard ?  ?  ?  ?  ?  ?General transfer comment: cues for hand placement to push from seated surface. ?  ?  ?Balance Overall balance assessment: Needs assistance ?Sitting-balance support: Feet supported ?Sitting balance-Leahy Scale: Good ?  ?  ?Standing balance support: Bilateral upper extremity supported ?Standing balance-Leahy Scale: Fair ?  ?  ?  ?  ?  ?  ?  ?  ?  ?  ?  ?  ?   ? ?ADL either performed or assessed with clinical judgement  ? ?ADL Overall ADL's : Needs assistance/impaired ?  ?  ?Grooming: Oral care;Brushing hair;Set up;Standing ?  ?  ?  ?  ?  ?  ?  ?  ?  ?Toilet Transfer: Supervision/safety;Rolling walker (2 wheels);Grab bars ?Toilet Transfer Details (indicate cue type and reason): provided instruction for use of 3in1 over toilet at home; pt denied need to use it today as toilet in hospital room is slightly higher than at home. ?Toileting- Clothing Manipulation and Hygiene: Set up;Sit to/from stand ?Toileting - Clothing Manipulation Details (indicate cue type and reason): provided  instruction and picture of button buddy for potential use at home ?  ?  ?Functional mobility during ADLs: Supervision/safety;Rolling walker (2 wheels) ?  ?  ? ?Extremity/Trunk Assessment Upper Extremity Assessment ?Upper Extremity Assessment: Overall WFL for tasks assessed ?  ?Lower Extremity Assessment ?Lower Extremity Assessment: Generalized weakness ?  ?Cervical / Trunk Assessment ?Cervical / Trunk  Assessment: Back Surgery ?  ? ?Vision Baseline Vision/History: 1 Wears glasses ?Patient Visual Report: No change from baseline ?  ?  ?   ?  ?   ?  ? ?Cognition Arousal/Alertness: Awake/alert ?Behavior During Therapy: [facility] for tasks assessed/performed ?Overall Cognitive Status: Within Functional Limits for tasks assessed ?  ?  ?  ?  ?  ?  ?  ?  ?  ?  ?  ?  ?  ?  ?  ?  ?General Comments: pleasant and eager to participate ?  ?  ?   ?   ? ?  ?   ? ? ?  ?General Comments    ? ? ?Pertinent Vitals/ Pain       Pain Assessment ?Pain Assessment: 0-10 ?Pain Score: 6  ?Pain Location: low back ?Pain Descriptors / Indicators: Operative site guarding, Aching ?Pain Intervention(s): Limited activity within patient's tolerance, Premedicated before session, Monitored during session, Repositioned ? ?   ?  ?  ?  ?  ?  ?  ?  ?  ?  ?  ?  ?  ?  ?  ?  ?  ?  ?  ? ?  ?    ?  ?  ?  ?   ? ?Frequency ? Min 2X/week  ? ? ? ? ?  ?Progress Toward Goals ? ?OT Goals(current goals can now be found in the care plan section) ? Progress towards OT goals: Progressing toward goals ? ?Acute Rehab OT Goals ?Patient Stated Goal: To return home ?OT Goal Formulation: With patient ?Time For Goal Achievement: 04/06/22 ?Potential to Achieve Goals: Good  ?Plan Discharge plan remains appropriate   ? ? ? ? ?   ?  ?  ?  ?  ? ?  ?AM-PAC OT "6 Clicks" Daily Activity     ?Outcome Measure ? ? Help from another person eating meals?: None ?Help from another person taking care of personal grooming?: A Little ?Help from another person toileting, which includes using toliet, bedpan, or urinal?: A Little ?Help from another person bathing (including washing, rinsing, drying)?: A Little ?Help from another person to put on and taking off regular upper body clothing?: A Little ?Help from another person to put on and taking off regular lower body clothing?: A Little ?6 Click Score: 19 ? ?  ?End of Session Equipment Utilized During Treatment: Gait belt;Rolling walker (2 wheels) ? ?OT  Visit Diagnosis: Unsteadiness on feet (R26.81);Muscle weakness (generalized) (M62.81) ?  ?Activity Tolerance Patient tolerated treatment well ?  ?Patient Left in chair;with call bell/phone within reach ?  ?Nurse Communication Mobility status ?  ? ?   ? ?Time: 7425-9563 ?OT Time Calculation (min): 43 min ? ?Charges: OT General Charges ?$OT Visit: 1 Visit ?OT Treatments ?$Self Care/Home Management : 38-52 mins ? ?Leta Speller, MS, OTR/L ? ? ?Darleene Cleaver ?03/25/2022, 12:16 PM ?

## 2022-03-25 NOTE — Discharge Summary (Signed)
Physician Discharge Summary  ?Patient ID: ?Katie Hodges ?MRN: [id number] ?DOB/AGE: 12/25/1940 81 y.o. ? ?Admit date: 03/22/2022 ?Discharge date: 03/25/2022 ? 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 ?Discharge Diagnoses:  ?Principal Problem: ?  Acute respiratory failure with hypoxia (HCC) ?Active Problems: ?  S/P lumbar fusion ?  Acute encephalopathy ?  Paroxysmal atrial fibrillation (HCC) ?  Uncontrolled type 2 diabetes mellitus with hyperglycemia, without long-term current use of insulin (HCC) ?  Depression ?  Psoriasis ?  Essential hypertension ?  Dyslipidemia ?  GERD (gastroesophageal reflux disease) ?  Obstructive sleep apnea ? ? ?Discharged Condition: good ? ?Hospital Course:  ?MACHELE DEIHL [facility] is a 81 y.o s/p L2-3 ALIF and PSF. Her interoperative course was uncomplicated. She suffered from hypoxia on POD0 and was transferred to a higher level of care and Internal medicine was consulted. She was stabilized and able to be transferred back to the floor. She was evaluated by therapy and deemed appropriate for discharge home with home health. She was discharged on POD3. Medicine recommended adding Losartan for her BP and close follow up with her PCP.  ? ?Consults: Internal Medicine ? * Acute respiratory failure with hypoxia (HCC) ?Multifactorial secondary to opiates as well as OSA with CPAP requirement. ?--supplement O2 per protocol ?--maintain sats >90% ?  ?Acute encephalopathy ?Secondary to hypoxia and opiate related respiratory depression.  Responded to IV Narcan.   Now resolved. ?  ?Uncontrolled type 2 diabetes mellitus with hyperglycemia, without long-term current use of insulin (HCC) ?Uncontrolled, Hbg A1c is 8.9%. ?Appears not on diabetic meds outpatient. ?--Supplemental coverage with NovoLog with moderate intensity. ?--Continue Neurontin for peripheral neuropathy. ?--Consider starting on metformin ?--Close PCP  follow up ?  ?S/P lumbar fusion ?Management as per Dr. Marcell Barlow. ?- Caution with narcotics for analgesia ?--Recommend multi-modal pain control to minimize need for opiates ?--Resumed scheduled Tylenol  ?--Pain mgmt per primary team ?  ?Obstructive sleep apnea ?Resume home CPAP ?  ?Paroxysmal atrial fibrillation (HCC) ?In normal sinus rhythm. ?- Eliquis is being held off for operative intervention. ?- It can be resumed.  Dr. Marcell Barlow is surgically appropriate. ?  ?GERD (gastroesophageal reflux disease) ?Continue PPI  ?  ?Dyslipidemia ?Continue statin ?  ?Essential hypertension ?Continue Cardizem CD. ?  ?Psoriasis ?Continue her ENSTILAR foam. ?  ?Depression ?continue Cymbalta ?  ? ?Significant Diagnostic Studies: None  ? ?Treatments: surgery: as above. Please see separately dictated operative report for further details. ? ?Discharge Exam: ?Blood pressure (!) 160/82, pulse 74, temperature 98 ?F (36.7 ?C), temperature source Oral, resp. rate 16, height 5\' 6"  (1.676 m), weight 85.5 kg, SpO2 91 %. ?Drowsy but arouses easily to voice.  ?CNI ?Strength:5/5 throughout BLE ?Incisions c/d/i ? ?Disposition: Discharge disposition: 06-Home-Health Care Svc ? ? ? ? ? ? ?Discharge Instructions   ? ? Incentive spirometry RT   Complete by: As directed ?  ? Increase activity slowly   Complete by: As directed ?  ? Remove dressing in 24 hours   Complete by: As directed ?  ? ?  ? ?Allergies as of 03/25/2022   ? ?   Reactions  ? Clindamycin Rash  ? Sulfa Antibiotics Rash  ? ?  ? ?  ?Medication List  ?  ? ?STOP taking these medications   ? ?cephALEXin 500 MG capsule ?Commonly known as: KEFLEX ?  ?Cosentyx Sensoready (300 MG) 150 MG/ML Soaj ?Generic drug: Secukinumab (300 MG Dose) ?  ?Eliquis  2.5 MG Tabs tablet ?Generic drug: apixaban ?  ?HYDROcodone-acetaminophen 5-325 MG tablet ?Commonly known as: NORCO/VICODIN ?  ?ibuprofen 400 MG tablet ?Commonly known as: ADVIL ?  ? ?  ? ?TAKE these medications   ? ?aspirin EC 81 MG tablet ?Take 81 mg  by mouth daily. Swallow whole. ?  ?azelastine 0.1 % nasal spray ?Commonly known as: ASTELIN ?Place 1-2 sprays into both nostrils daily as needed for rhinitis. ?  ?calcipotriene 0.005 % ointment ?Commonly known as: DOVONOX ?Apply topically 2 (two) times daily. ?  ?clobetasol cream 0.05 % ?Commonly known as: TEMOVATE ?Apply 1 application. topically 2 (two) times daily. ?  ?diltiazem 240 MG 24 hr capsule ?Commonly known as: CARDIZEM CD ?Take 240 mg by mouth at bedtime. ?  ?DULoxetine 60 MG capsule ?Commonly known as: CYMBALTA ?Take 60 mg by mouth daily. ?  ?Enstilar 0.005-0.064 % Foam ?Generic drug: Calcipotriene-Betameth Diprop ?Apply topically. ?  ?gabapentin 300 MG capsule ?Commonly known as: NEURONTIN ?Take 300 mg by mouth at bedtime. ?  ?gabapentin 100 MG capsule ?Commonly known as: NEURONTIN ?Take 100 mg by mouth daily. ?  ?losartan 25 MG tablet ?Commonly known as: COZAAR ?Take 1 tablet (25 mg total) by mouth daily. ?  ?meclizine 12.5 MG tablet ?Commonly known as: ANTIVERT ?Take 12.5 mg by mouth 3 (three) times daily as needed for dizziness. ?  ?methocarbamol 500 MG tablet ?Commonly known as: ROBAXIN ?Take 1 tablet (500 mg total) by mouth every 8 (eight) hours as needed for muscle spasms. ?  ?metroNIDAZOLE 0.75 % cream ?Commonly known as: METROCREAM ?Apply topically 2 (two) times daily. ?  ?MULTIVITAMIN ADULT PO ?Take by mouth. ?  ?oxyCODONE 5 MG immediate release tablet ?Commonly known as: Oxy IR/ROXICODONE ?Take 1 tablet (5 mg total) by mouth every 3 (three) hours as needed for moderate pain ((score 4 to 6)). ?  ?pantoprazole 40 MG tablet ?Commonly known as: PROTONIX ?Take 40 mg by mouth daily. ?  ?pravastatin 40 MG tablet ?Commonly known as: PRAVACHOL ?Take 40 mg by mouth daily. ?  ?senna 8.6 MG Tabs tablet ?Commonly known as: SENOKOT ?Take 1 tablet (8.6 mg total) by mouth 2 (two) times daily. ?  ?traZODone 50 MG tablet ?Commonly known as: DESYREL ?Take 50 mg by mouth at bedtime. ?  ?Vitamin D 125 MCG (5000  UT) Caps ?Take 10,000 Units by mouth daily. ?  ?Zoryve 0.3 % Crea ?Generic drug: Roflumilast ?Apply 1 application. topically at bedtime. Sample ?  ? ?  ? ?  ?  ? ? ?  ?Durable Medical Equipment  ?(From admission, onward)  ?  ? ? ?  ? ?  Start     Ordered  ? 03/24/22 1309  For home use only DME Walker rolling  Once       ?Question Answer Comment  ?Walker: With 5 Inch Wheels   ?Patient needs a walker to treat with the following condition Lumbago with sciatica, left side   ?  ? 03/24/22 1309  ? ?  ?  ? ?  ? ? Follow-up Information   ? ? Susanne BordersKoch, Achaia Garlock Lee, PA Follow up in 2 week(s).   ?Why: For post-op and incision check. This appointment date and time should be listed on your pre-op paperwork. Please call the clinic with any questions or concerns regarding appointment date and time ?Contact information: ?[address] ?[place] [address] ?[phone] ? ? ?  ?  ? ?  ?  ? ?  ? ? ?Signed: ?Avie Echevariaanielle Lee  Alycia Rossetti ?03/25/2022, 9:27 AM ? ? ?

## 2022-03-25 NOTE — Care Management Important Message (Signed)
Important Message ? ?Patient Details  ?Name: Katie Hodges ?MRN: [id number] ?Date of Birth: 1941-05-13 ? ? ?Medicare Important Message Given:  N/A - LOS <3 / Initial given by admissions ? ? ? ? ?Juliann Pulse A Stevie Charter ?03/25/2022, 9:23 AM ?

## 2022-03-25 NOTE — Plan of Care (Signed)

## 2022-03-26 ENCOUNTER — Encounter: Payer: Self-pay | Admitting: Neurosurgery

## 2022-06-19 ENCOUNTER — Other Ambulatory Visit: Payer: Self-pay

## 2022-06-19 DIAGNOSIS — Z981 Arthrodesis status: Secondary | ICD-10-CM

## 2022-06-20 ENCOUNTER — Ambulatory Visit: Payer: Medicare Other | Admitting: Neurosurgery

## 2022-06-20 ENCOUNTER — Encounter: Payer: Self-pay | Admitting: Neurosurgery

## 2022-06-20 ENCOUNTER — Ambulatory Visit
Admission: RE | Admit: 2022-06-20 | Discharge: 2022-06-20 | Disposition: A | Discharge status: Home / Self Care | Payer: Medicare Other | Source: Ambulatory Visit | Attending: Neurosurgery | Admitting: Neurosurgery

## 2022-06-20 ENCOUNTER — Ambulatory Visit
Admission: RE | Admit: 2022-06-20 | Discharge: 2022-06-20 | Disposition: A | Discharge status: Home / Self Care | Payer: Medicare Other | Source: Ambulatory Visit | Attending: Internal Medicine | Admitting: Internal Medicine

## 2022-06-20 VITALS — BP 146/86 | Ht 66.0 in | Wt 182.8 lb

## 2022-06-20 DIAGNOSIS — Z981 Arthrodesis status: Secondary | ICD-10-CM

## 2022-06-20 NOTE — Progress Notes (Signed)
   DOS: 03/22/22 left T12-L1 decompression. L2-3 XLIF, L2-4 PSF  HISTORY OF PRESENT ILLNESS: 06/20/2022 Ms. Beulah Kahlyn Shippey is status post the above surgery.  She is doing very well.  She did have a fall and has some back pain, but her symptoms are much improved.   PHYSICAL EXAMINATION:   Vitals:   06/20/22 1005  BP: (!) 146/86   General: Patient is well developed, well nourished, calm, collected, and in no apparent distress.  NEUROLOGICAL:  General: In no acute distress.  Awake, alert, oriented to person, place, and time. Pupils equal round and reactive to light.   Strength:  Side Iliopsoas Quads Hamstring PF DF EHL  R 5 5 5 5 5 5   L 5 5 5 5 5 5    Incision c/d/i   ROS (Neurologic): Negative except as noted above  IMAGING: No complications noted  ASSESSMENT/PLAN:  is doing well after lumbar spine intervention as noted above.  She is doing very well.  We discussed that she is no longer on any activity limitations.  She is free to take NSAIDs.  I will see her back in 6 months with x-rays.  I spent a total of 15 minutes in face-to-face and non-face-to-face activities related to this patient's care today.   MD, [facility] Department of Neurosurgery

## 2022-09-30 ENCOUNTER — Other Ambulatory Visit: Payer: Self-pay | Admitting: Neurosurgery

## 2022-12-23 ENCOUNTER — Other Ambulatory Visit: Payer: Self-pay

## 2022-12-23 DIAGNOSIS — Z981 Arthrodesis status: Secondary | ICD-10-CM

## 2022-12-24 ENCOUNTER — Encounter: Payer: Self-pay | Admitting: Neurosurgery

## 2022-12-24 ENCOUNTER — Ambulatory Visit
Admission: RE | Admit: 2022-12-24 | Discharge: 2022-12-24 | Disposition: A | Discharge status: Home / Self Care | Payer: Medicare Other | Attending: Neurosurgery | Admitting: Neurosurgery

## 2022-12-24 ENCOUNTER — Ambulatory Visit (INDEPENDENT_AMBULATORY_CARE_PROVIDER_SITE_OTHER): Payer: Medicare Other | Admitting: Neurosurgery

## 2022-12-24 ENCOUNTER — Ambulatory Visit
Admission: RE | Admit: 2022-12-24 | Discharge: 2022-12-24 | Disposition: A | Discharge status: Home / Self Care | Payer: Medicare Other | Source: Ambulatory Visit | Attending: Neurosurgery | Admitting: Neurosurgery

## 2022-12-24 VITALS — BP 134/76 | HR 81 | Wt 175.4 lb

## 2022-12-24 DIAGNOSIS — Z09 Encounter for follow-up examination after completed treatment for conditions other than malignant neoplasm: Secondary | ICD-10-CM

## 2022-12-24 DIAGNOSIS — Z981 Arthrodesis status: Secondary | ICD-10-CM

## 2022-12-24 DIAGNOSIS — M5416 Radiculopathy, lumbar region: Secondary | ICD-10-CM

## 2022-12-24 NOTE — Progress Notes (Signed)
   DOS: 03/22/22 left T12-L1 decompression. L2-3 XLIF, L2-4 PSF  HISTORY OF PRESENT ILLNESS: 12/24/2022 Ms. Pella is status post the above surgery.  She is doing very well.    PHYSICAL EXAMINATION:   Vitals:   12/24/22 1025  BP: 134/76  Pulse: 81   General: Patient is well developed, well nourished, calm, collected, and in no apparent distress.  NEUROLOGICAL:  General: In no acute distress.  Awake, alert, oriented to person, place, and time. Pupils equal round and reactive to light.   Strength:  Side Iliopsoas Quads Hamstring PF DF EHL  R 5 5 5 5 5 5   L 5 5 5 5 5 5    Incision c/d/i   ROS (Neurologic): Negative except as noted above  IMAGING: No complications noted  ASSESSMENT/PLAN:  Fonnie Mu is doing well after lumbar spine intervention as noted above.    I will see her back on an as-needed basis.  I am very pleased with her response to surgery.  Meade Maw MD, [facility] Department of Neurosurgery

## 2023-11-18 IMAGING — CT CT L SPINE W/O CM
3 of 5 series · 12 of 33 positions shown, 13 images · non-contrast
Comparison: None.

CLINICAL DATA: Low back pain radiating to the groin for several
months. History of prior lumbar fusion.



[Series 3: lspine st l-spine 2.00 · axial · 0.30mm/px · z∈[-1300,-1132]mm · 4 of 126 slices shown, 5 images]
[im 21/126  soft-tissue]
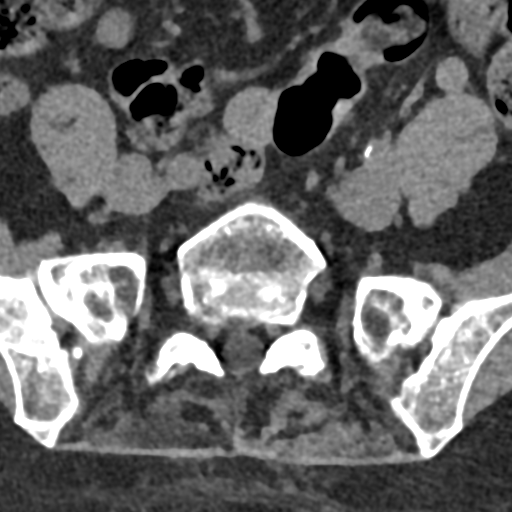
[im 21/126  bone]
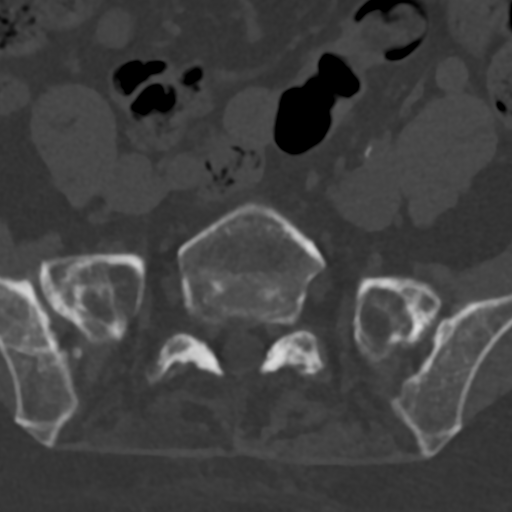
[im 42/126  bone]
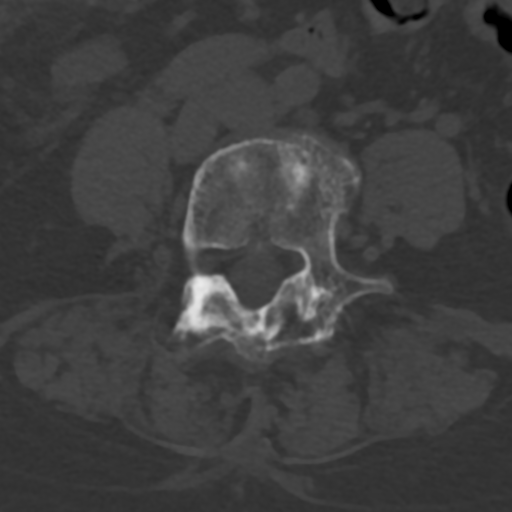
[im 84/126  bone]
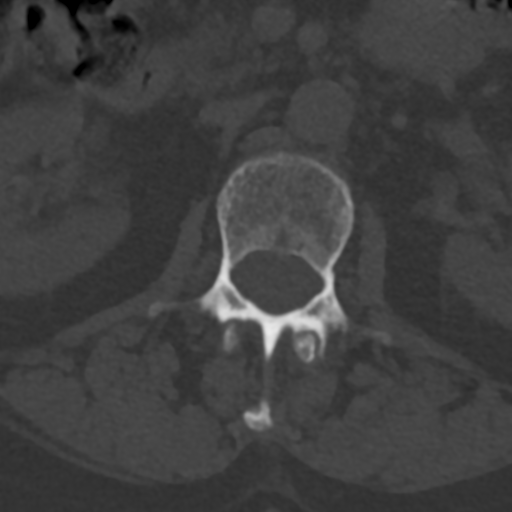
[im 105/126  bone]
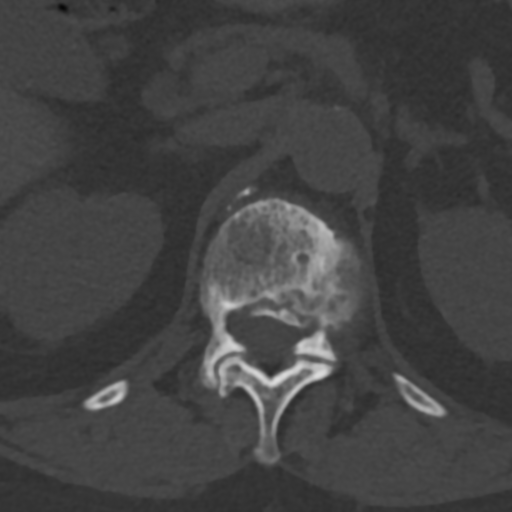

[Series 5: sag bone l-spine 2.00 sag · sagittal · 0.30mm/px · 5 of 77 slices shown]
[im 13/77  bone]
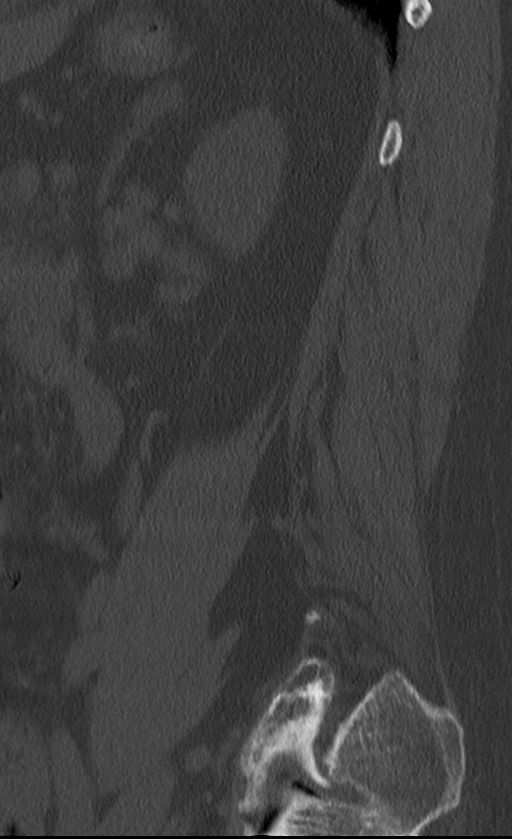
[im 26/77  bone]
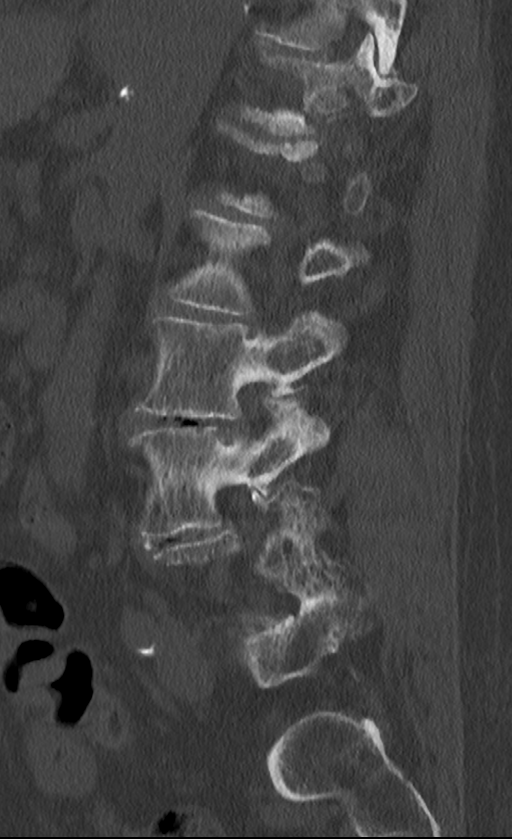
[im 39/77  bone]
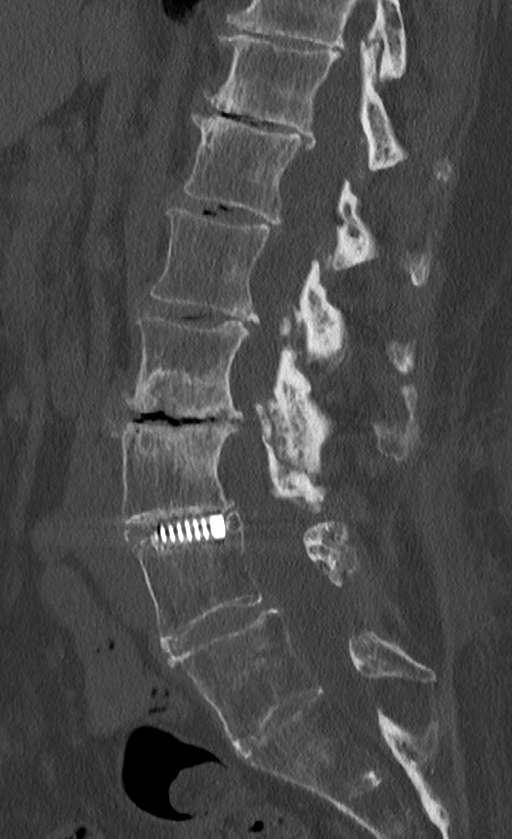
[im 51/77  bone]
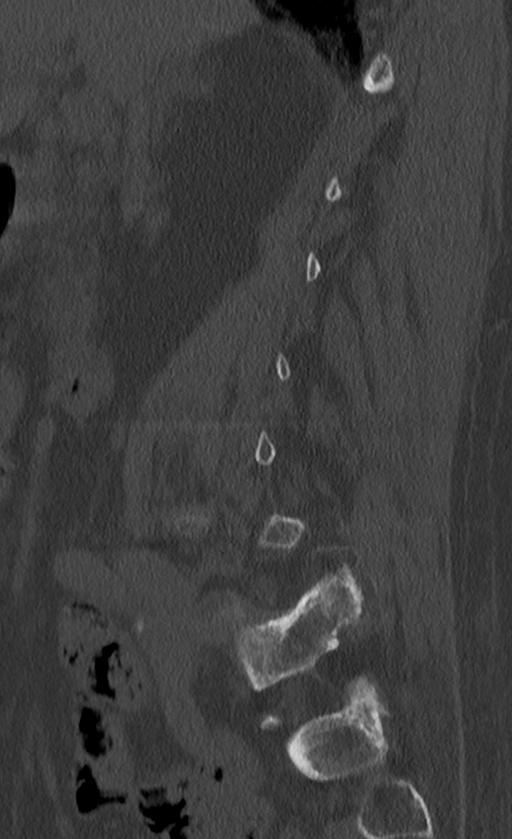
[im 64/77  bone]
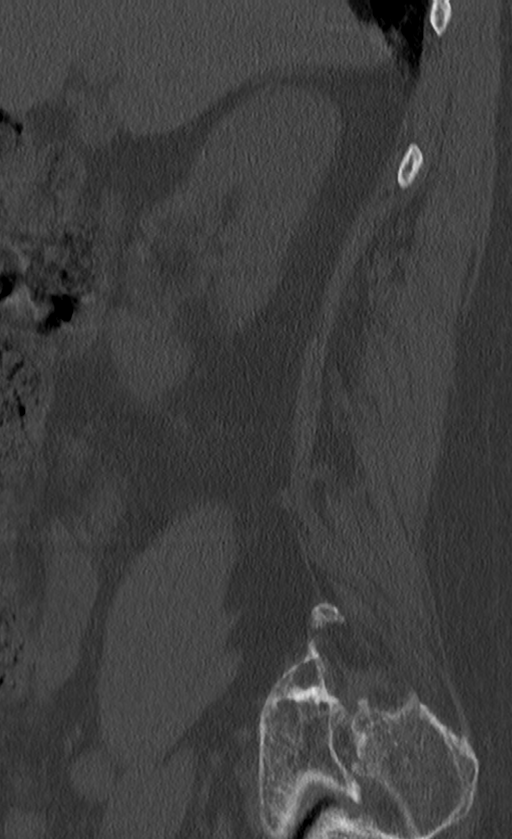

[Series 9: cor bone l-spine 2.00 cor · coronal · 0.30mm/px · 3 of 77 slices shown]
[im 16/77  bone]
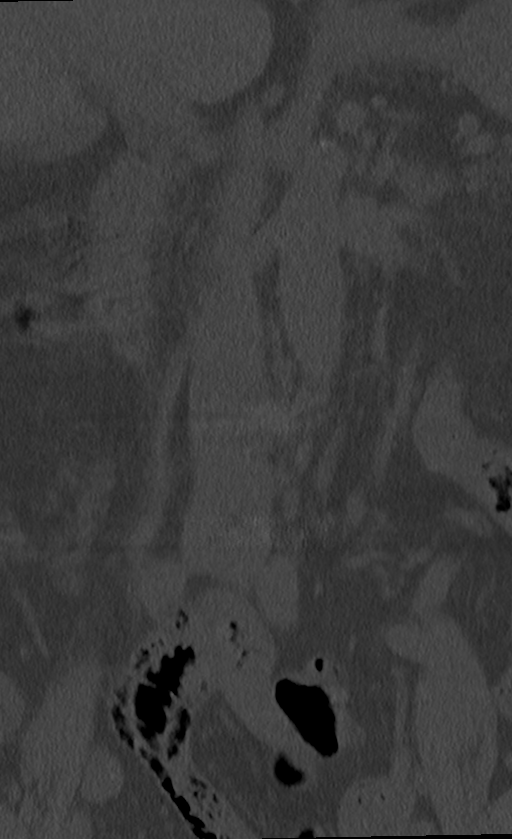
[im 31/77  bone]
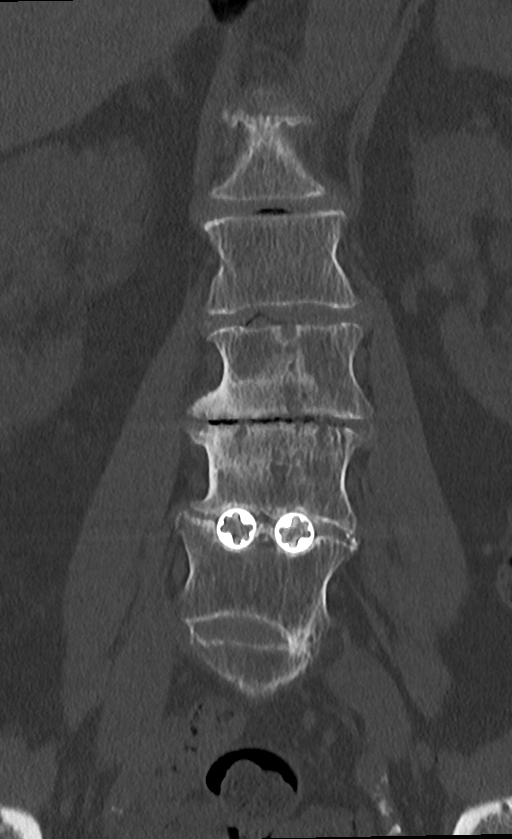
[im 46/77  bone]
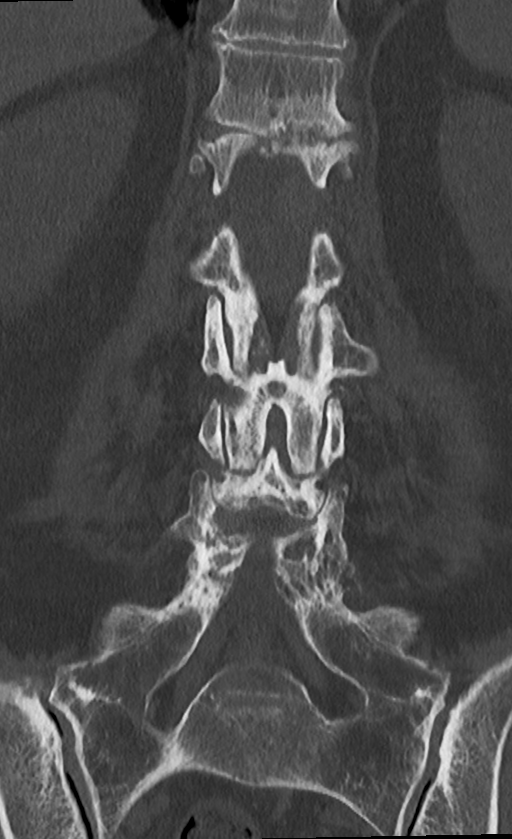

[12 of 33 positions shown; findings below may reference images not displayed]

FINDINGS: Segmentation: 5 lumbar type vertebral bodies.

Alignment: Slight anterior subluxation of L4 on L5 and L5 on S1.
Otherwise normal alignment. Normal alignment of the posterior
elements.

Vertebrae: No vertebral compression deformities. No focal bone
lesion or bone destruction. Areas of endplate sclerosis around the
T12-L1 and L3-4 levels consistent with degenerative type changes.
Small subcortical cysts.

Paraspinal and other soft tissues: No abnormal paraspinal soft
tissue mass or infiltration. Posterior paraspinal muscles appear
intact. Mild calcification of the aorta without aneurysm.

Disc levels:

T11-12: Mild bone encroachment upon the central canal caused by disc
osteophyte complex and posterior facet joint hypertrophy.

T12-L1: Severe loss of disc height with degenerative disc disease.
Disc osteophyte complex causes effacement of the anterior thecal sac
and left lateral recess.

L1-2: Degenerative disc disease with decreased disc height. Diffuse
bulging disc annulus cause effacement of the anterior thecal sac
with possible left paracentral protrusion causing effacement the
left lateral recess.

L2-3: Loss of disc height. Moderate central stenosis caused by disc
osteophyte complex and posterior facet joint hypertrophy.

L3-4: Severe loss of disc height with degenerative disc disease.
Prominent posterior osteophyte. Disc osteophyte complex combines
with posterior facet joint hypertrophy to produce severe central
canal stenosis.

L4-5: And intervertebral disc prosthesis is present. Postoperative
posterior laminectomy. No significant central stenosis.

L5-S1: Diffuse bulging disc annulus without significant central
stenosis. Posterior laminectomies.
IMPRESSION: 1. Postoperative changes with posterior laminectomies at L4-5 and
L5-S1. Intervertebral disc prosthesis at L4-5.
2. Multilevel degenerative changes as discussed above.

## 2025-02-18 ENCOUNTER — Inpatient Hospital Stay (HOSPITAL_COMMUNITY): Admit: 2025-02-18 | Admitting: Orthopedic Surgery

## 2025-02-18 SURGERY — ARTHROPLASTY, KNEE, TOTAL
Anesthesia: Choice | Site: Knee | Laterality: Left
